# Patient Record
Sex: Female | Born: 1995 | Race: White | Hispanic: No | Marital: Single | Smoking: Former smoker
Health system: Southern US, Community
[De-identification: ages and names within clinical notes are randomized; demographics above are authoritative.]

## PROBLEM LIST (undated history)

## (undated) DIAGNOSIS — R112 Nausea with vomiting, unspecified: Secondary | ICD-10-CM

## (undated) DIAGNOSIS — K8020 Calculus of gallbladder without cholecystitis without obstruction: Secondary | ICD-10-CM

## (undated) DIAGNOSIS — R519 Headache, unspecified: Secondary | ICD-10-CM

## (undated) DIAGNOSIS — G932 Benign intracranial hypertension: Secondary | ICD-10-CM

## (undated) DIAGNOSIS — F419 Anxiety disorder, unspecified: Secondary | ICD-10-CM

## (undated) DIAGNOSIS — F410 Panic disorder [episodic paroxysmal anxiety] without agoraphobia: Secondary | ICD-10-CM

## (undated) DIAGNOSIS — F329 Major depressive disorder, single episode, unspecified: Secondary | ICD-10-CM

## (undated) DIAGNOSIS — T7840XA Allergy, unspecified, initial encounter: Secondary | ICD-10-CM

## (undated) DIAGNOSIS — E282 Polycystic ovarian syndrome: Secondary | ICD-10-CM

## (undated) DIAGNOSIS — K589 Irritable bowel syndrome without diarrhea: Secondary | ICD-10-CM

## (undated) HISTORY — DX: Anxiety disorder, unspecified: F41.9

## (undated) HISTORY — PX: COLONOSCOPY: SHX174

## (undated) HISTORY — DX: Major depressive disorder, single episode, unspecified: F32.9

## (undated) HISTORY — PX: UPPER GI ENDOSCOPY: SHX6162

## (undated) HISTORY — DX: Benign intracranial hypertension: G93.2

## (undated) HISTORY — DX: Allergy, unspecified, initial encounter: T78.40XA

---
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
## 2017-01-17 ENCOUNTER — Other Ambulatory Visit: Payer: Self-pay | Admitting: Physician Assistant

## 2017-01-27 ENCOUNTER — Encounter: Payer: Self-pay | Admitting: Physician Assistant

## 2017-03-22 NOTE — Progress Notes (Signed)
Assessment and Plan:    Migraine without aura and responsive to treatment Continue effexor, avoid triggers, normal neuro  Essential hypertension -     CBC with Differential/Platelet -     BASIC METABOLIC PANEL WITH GFR -     Hepatic function panel -     TSH -     Urinalysis, Routine w reflex microscopic -     Microalbumin / creatinine urine ratio  Gastroesophageal reflux disease without esophagitis  Anxiety -     diazepam (VALIUM) 2 MG tablet; Take 1 tablet (2 mg total) by mouth every 12 (twelve) hours as needed for anxiety.  Screening cholesterol level -     Lipid panel  Medication management -     Magnesium  Vitamin D deficiency -     VITAMIN D 25 Hydroxy (Vit-D Deficiency, Fractures)  Screening for deficiency anemia -     Vitamin B12  Routine general medical examination at a health care facility  Morbid Obesity with co morbidities - long discussion about weight loss, diet, and exercise  Continue diet and meds as discussed. Further disposition pending results of labs.  HPI 21 y.o. female  presents for new patient physical, has been seeing pediatrician.   Her blood pressure has been controlled at home, today their BP is BP: 122/90  She started on effexor July 2014, she is on 3 a day and doing well, looking in to online therapy.  She states the xanax does not effect her, will try valium 2mg  PRN #10 She denies headaches.   She does workout, does yoga 3-4 times and runs a few times a week. She denies chest pain, shortness of breath, dizziness. At [employer], and will transfer to [employer].  Has boyfriend, Patricia Davis, psych.  On the patch, likes it, menses are regular.  Has family history of PCOS.  BMI is Body mass index is 32.49 kg/m., she is working on diet and exercise. Wt Readings from Last 3 Encounters:  03/23/17 226 lb 6.4 oz (102.7 kg) (99 %, Z= 2.27)*  01/21/16 227 lb 6.4 oz (103.1 kg) (99 %, Z= 2.28)*  10/20/15 225 lb (102.1 kg) (99 %, Z= 2.26)*   *  Growth percentiles are based on CDC 2-20 Years data.     Current Medications:  Current Outpatient Prescriptions on File Prior to Visit  Medication Sig  . ALPRAZolam (XANAX) 0.5 MG tablet 1/2-1 table as needed up to 2 x a day for anxiety  . venlafaxine XR (EFFEXOR-XR) 75 MG 24 hr capsule Take 3 capsules (225 mg total) by mouth daily with breakfast.  . Burr Medico 150-35 MCG/24HR transdermal patch APPLY ONE PATCH TOPICALLY ONCE A WEEK   No current facility-administered medications on file prior to visit.    Medical History:  Past Medical History:  Diagnosis Date  . Anxiety   . Pseudotumor cerebri 2010   TDAP 2010 HPV vaccines- will start Up to date on rest- see records from peds  Review of Systems:  Review of Systems  Constitutional: Negative.   HENT: Negative.   Eyes: Negative.   Respiratory: Negative.   Cardiovascular: Negative for chest pain, palpitations, orthopnea, claudication, leg swelling and PND.  Gastrointestinal: Negative.   Genitourinary: Negative.  Negative for frequency.  Musculoskeletal: Negative.   Skin: Negative.   Neurological: Negative.  Negative for dizziness.  Psychiatric/Behavioral: Negative for depression, hallucinations, memory loss, substance abuse and suicidal ideas. The patient is not nervous/anxious (improved with effexor) and does not have insomnia.    Allergies  Allergies  Allergen Reactions  . Sertraline     Other reaction(s): Laryngeal Edema (ALLERGY)  . Bactrim [Sulfamethoxazole-Trimethoprim] Other (See Comments)    Nausea, yeast    SURGICAL HISTORY She  has a past surgical history that includes Wisdom tooth extraction (05/2014). FAMILY HISTORY Her family history includes Depression in her father and mother. SOCIAL HISTORY She  reports that she has never smoked. She has never used smokeless tobacco. She reports that she does not drink alcohol or use drugs.  Physical Exam: BP 122/90   Pulse 88   Temp (!) 97.3 F (36.3 C)   Resp 16    Ht 5\' 10"  (1.778 m)   Wt 226 lb 6.4 oz (102.7 kg)   LMP 03/22/2017   SpO2 98%   BMI 32.49 kg/m  Wt Readings from Last 3 Encounters:  03/23/17 226 lb 6.4 oz (102.7 kg) (99 %, Z= 2.27)*  01/21/16 227 lb 6.4 oz (103.1 kg) (99 %, Z= 2.28)*  10/20/15 225 lb (102.1 kg) (99 %, Z= 2.26)*   * Growth percentiles are based on CDC 2-20 Years data.   General Appearance: Well nourished, in no apparent distress. Eyes: PERRLA, EOMs, conjunctiva no swelling or erythema Sinuses: No Frontal/maxillary tenderness ENT/Mouth: Ext aud canals clear, TMs without erythema, bulging. No erythema, swelling, or exudate on post pharynx.  Tonsils not swollen or erythematous. Hearing normal.  Neck: Supple, thyroid normal.  Respiratory: Respiratory effort normal, BS equal bilaterally without rales, rhonchi, wheezing or stridor.  Cardio: RRR with no MRGs. Brisk peripheral pulses without edema.  Abdomen: Soft, + BS,  Non tender, no guarding, rebound, hernias, masses. Lymphatics: Non tender without lymphadenopathy.  Musculoskeletal: Full ROM, 5/5 strength, Normal gait Skin: Warm, dry without rashes, lesions, ecchymosis.  Neuro: Cranial nerves intact. Normal muscle tone, no cerebellar symptoms. Psych: Awake and oriented X 3, normal affect, Insight and Judgment appropriate.    Patricia MullingAmanda Ninoshka Wainwright, PA-C 4:04 PM [facility]

## 2017-03-23 ENCOUNTER — Ambulatory Visit (INDEPENDENT_AMBULATORY_CARE_PROVIDER_SITE_OTHER): Payer: 59 | Admitting: Physician Assistant

## 2017-03-23 ENCOUNTER — Encounter: Payer: Self-pay | Admitting: Physician Assistant

## 2017-03-23 VITALS — BP 122/90 | HR 88 | Temp 97.3°F | Resp 16 | Ht 70.0 in | Wt 226.4 lb

## 2017-03-23 DIAGNOSIS — I10 Essential (primary) hypertension: Secondary | ICD-10-CM

## 2017-03-23 DIAGNOSIS — F410 Panic disorder [episodic paroxysmal anxiety] without agoraphobia: Secondary | ICD-10-CM

## 2017-03-23 DIAGNOSIS — E6609 Other obesity due to excess calories: Secondary | ICD-10-CM

## 2017-03-23 DIAGNOSIS — K219 Gastro-esophageal reflux disease without esophagitis: Secondary | ICD-10-CM

## 2017-03-23 DIAGNOSIS — Z13220 Encounter for screening for lipoid disorders: Secondary | ICD-10-CM

## 2017-03-23 DIAGNOSIS — G43009 Migraine without aura, not intractable, without status migrainosus: Secondary | ICD-10-CM

## 2017-03-23 DIAGNOSIS — F419 Anxiety disorder, unspecified: Secondary | ICD-10-CM

## 2017-03-23 DIAGNOSIS — Z79899 Other long term (current) drug therapy: Secondary | ICD-10-CM

## 2017-03-23 DIAGNOSIS — E559 Vitamin D deficiency, unspecified: Secondary | ICD-10-CM

## 2017-03-23 DIAGNOSIS — Z6832 Body mass index (BMI) 32.0-32.9, adult: Secondary | ICD-10-CM

## 2017-03-23 DIAGNOSIS — Z130 Encounter for screening for diseases of the blood and blood-forming organs and certain disorders involving the immune mechanism: Secondary | ICD-10-CM

## 2017-03-23 DIAGNOSIS — Z0000 Encounter for general adult medical examination without abnormal findings: Secondary | ICD-10-CM

## 2017-03-23 MED ORDER — DIAZEPAM 2 MG PO TABS: 2.0000 mg | ORAL_TABLET | Freq: Two times a day (BID) | ORAL | 0 refills | Status: AC | PRN

## 2017-03-23 NOTE — Patient Instructions (Addendum)
Drink 80-100 oz a day of water, measure it out Eat 3 meals a day, have to do breakfast, eat protein- hard boiled eggs, protein bar like nature valley protein bar, greek yogurt like oikos triple zero, chobani 100, or light n fit greek   HOW TO TREAT VIRAL COUGH AND COLD SYMPTOMS:  -Symptoms usually last at least 1 week with the worst symptoms being around day 4.  - colds usually start with a sore throat and end with a cough, and the cough can take 2 weeks to get better.  -No antibiotics are needed for colds, flu, sore throats, cough, bronchitis UNLESS symptoms are longer than 7 days OR if you are getting better then get drastically worse.  -There are a lot of combination medications (Dayquil, Nyquil, Vicks 44, tyelnol cold and sinus, ETC). Please look at the ingredients on the back so that you are treating the correct symptoms and not doubling up on medications/ingredients.    Medicines you can use  Nasal congestion  - pseudoephedrine (Sudafed)- behind the counter, do not use if you have high blood pressure, medicine that have -D in them.  - phenylephrine (Sudafed PE) -Dextormethorphan + chlorpheniramine (Coridcidin HBP)- okay if you have high blood pressure -Oxymetazoline (Afrin) nasal spray- LIMIT to 3 days -Saline nasal spray -Neti pot (used distilled or bottled water)  Ear pain/congestion  -pseudoephedrine (sudafed) - Nasonex/flonase nasal spray  Fever  -Acetaminophen (Tyelnol) -Ibuprofen (Advil, motrin, aleve)  Sore Throat  -Acetaminophen (Tyelnol) -Ibuprofen (Advil, motrin, aleve) -Drink a lot of water -Gargle with salt water - Rest your voice (don't talk) -Throat sprays -Cough drops  Body Aches  -Acetaminophen (Tyelnol) -Ibuprofen (Advil, motrin, aleve)  Headache  -Acetaminophen (Tyelnol) -Ibuprofen (Advil, motrin, aleve) - Exedrin, Exedrin Migraine  Allergy symptoms (cough, sneeze, runny nose, itchy eyes) -Claritin or loratadine cheapest but likely the weakest   -Zyrtec or certizine at night because it can make you sleepy -The strongest is allegra or fexafinadine  Cheapest at [employer], [employer], [employer]  Cough  -Dextromethorphan (Delsym)- medicine that has DM in it -Guafenesin (Mucinex/Robitussin) - cough drops - drink lots of water  Chest Congestion  -Guafenesin (Mucinex/Robitussin)  Red Itchy Eyes  - Naphcon-A  Upset Stomach  - Bland diet (nothing spicy, greasy, fried, and high acid foods like tomatoes, oranges, berries) -OKAY- cereal, bread, soup, crackers, rice -Eat smaller more frequent meals -reduce caffeine, no alcohol -Loperamide (Imodium-AD) if diarrhea -Prevacid for heart burn  General health when sick  -Hydration -wash your hands frequently -keep surfaces clean -change pillow cases and sheets often -Get fresh air but do not exercise strenuously -Vitamin D, double up on it - Vitamin C -Zinc    Simple math prevails.    1st - exercise does not produce significant weight loss - at best one converts fat into muscle , "bulks up", loses inches, but usually stays "weight neutral"     2nd - think of your body weightas a check book: If you eat more calories than you burn up - you save money or gain weight .... Or if you spend more money than you put in the check book, ie burn up more calories than you eat, then you lose weight     3rd - if you walk or run 1 mile, you burn up 100 calories - you have to burn up 3,500 calories to lose 1 pound, ie you have to walk/run 35 miles to lose 1 measly pound. So if you want to lose 10 #, then you  have to walk/run 350 miles, so.... clearly exercise is not the solution.     4. So if you consume 1,500 calories, then you have to burn up the equivalent of 15 miles to stay weight neutral - It also stands to reason that if you consume 1,500 cal/day and don't lose weight, then you must be burning up about 1,500 cals/day to stay weight neutral.     5. If you really want to lose weight, you must cut  your calorie intake 300 calories /day and at that rate you should lose about 1 # every 3 days.   6. Please purchase Dr Francis Dowse Fuhrman's book(s) "The End of Dieting" & "Eat to Live" . It has some great concepts and recipes.

## 2017-03-24 LAB — URINE CULTURE
MICRO NUMBER:: 80883585
Result:: NO GROWTH
SPECIMEN QUALITY:: ADEQUATE

## 2017-03-24 LAB — LIPID PANEL
CHOL/HDL RATIO: 3.2 (calc) (ref <5.0)
Cholesterol: 223 mg/dL — ABNORMAL HIGH (ref <170)
HDL: 69 mg/dL (ref >45)
LDL CHOLESTEROL (CALC): 123 mg/dL — AB (ref <110)
NON-HDL CHOLESTEROL (CALC): 154 mg/dL — AB (ref <120)
Triglycerides: 185 mg/dL — ABNORMAL HIGH (ref <90)

## 2017-03-24 LAB — TSH: TSH: 2.17 mIU/L

## 2017-03-24 LAB — URINALYSIS, ROUTINE W REFLEX MICROSCOPIC
Bacteria, UA: NONE SEEN /HPF
Bilirubin Urine: NEGATIVE
Glucose, UA: NEGATIVE
HYALINE CAST: NONE SEEN /LPF
Hgb urine dipstick: NEGATIVE
Ketones, ur: NEGATIVE
Leukocytes, UA: NEGATIVE
Nitrite: NEGATIVE
SPECIFIC GRAVITY, URINE: 1.028 (ref 1.001–1.03)
pH: 6.5 (ref 5.0–8.0)

## 2017-03-24 LAB — CBC WITH DIFFERENTIAL/PLATELET
BASOS PCT: 0.4 %
Basophils Absolute: 36 cells/uL (ref 0–200)
EOS ABS: 364 {cells}/uL (ref 15–500)
Eosinophils Relative: 4 %
HCT: 43.9 % (ref 35.0–45.0)
Hemoglobin: 14.9 g/dL (ref 11.7–15.5)
Lymphs Abs: 2348 cells/uL (ref 850–3900)
MCH: 28.9 pg (ref 27.0–33.0)
MCHC: 33.9 g/dL (ref 32.0–36.0)
MCV: 85.2 fL (ref 80.0–100.0)
MONOS PCT: 9.3 %
MPV: 10.7 fL (ref 7.5–12.5)
Neutro Abs: 5506 cells/uL (ref 1500–7800)
Neutrophils Relative %: 60.5 %
PLATELETS: 321 10*3/uL (ref 140–400)
RBC: 5.15 10*6/uL — AB (ref 3.80–5.10)
RDW: 13.4 % (ref 11.0–15.0)
TOTAL LYMPHOCYTE: 25.8 %
WBC mixed population: 846 cells/uL (ref 200–950)
WBC: 9.1 10*3/uL (ref 3.8–10.8)

## 2017-03-24 LAB — HEPATIC FUNCTION PANEL
AG RATIO: 1.5 (calc) (ref 1.0–2.5)
ALKALINE PHOSPHATASE (APISO): 90 U/L (ref 47–176)
ALT: 12 U/L (ref 5–32)
AST: 19 U/L (ref 12–32)
Albumin: 4.4 g/dL (ref 3.6–5.1)
BILIRUBIN DIRECT: 0 mg/dL (ref 0.0–0.2)
BILIRUBIN INDIRECT: 0.2 mg/dL (ref 0.2–1.1)
GLOBULIN: 2.9 g/dL (ref 2.0–3.8)
Total Bilirubin: 0.2 mg/dL (ref 0.2–1.1)
Total Protein: 7.3 g/dL (ref 6.3–8.2)

## 2017-03-24 LAB — BASIC METABOLIC PANEL WITH GFR
BUN: 8 mg/dL (ref 7–20)
CO2: 24 mmol/L (ref 20–32)
CREATININE: 0.66 mg/dL (ref 0.50–1.00)
Calcium: 9.7 mg/dL (ref 8.9–10.4)
Chloride: 104 mmol/L (ref 98–110)
GFR, EST AFRICAN AMERICAN: 148 mL/min/{1.73_m2} (ref >=60)
GFR, Est Non African American: 128 mL/min/{1.73_m2} (ref >=60)
GLUCOSE: 82 mg/dL (ref 65–99)
Potassium: 3.6 mmol/L — ABNORMAL LOW (ref 3.8–5.1)
SODIUM: 140 mmol/L (ref 135–146)

## 2017-03-24 LAB — VITAMIN B12: VITAMIN B 12: 463 pg/mL (ref 200–1100)

## 2017-03-24 LAB — MICROALBUMIN / CREATININE URINE RATIO
CREATININE, URINE: 406 mg/dL — AB (ref 20–320)
MICROALB UR: 15.3 mg/dL
MICROALB/CREAT RATIO: 38 ug/mg{creat} — AB (ref <30)

## 2017-03-24 LAB — MAGNESIUM: MAGNESIUM: 1.9 mg/dL (ref 1.5–2.5)

## 2017-03-24 LAB — VITAMIN D 25 HYDROXY (VIT D DEFICIENCY, FRACTURES): VIT D 25 HYDROXY: 36 ng/mL (ref 30–100)

## 2017-03-29 NOTE — Progress Notes (Signed)
LVM for pt to return office call for LAB results.

## 2017-04-04 NOTE — Progress Notes (Signed)
LVM for pt to return office call for LAB results. Lab results w/ lab letter to explain lab results were mailed to pt.

## 2017-04-05 ENCOUNTER — Other Ambulatory Visit: Payer: Self-pay | Admitting: Internal Medicine

## 2017-04-20 ENCOUNTER — Ambulatory Visit: Payer: Self-pay

## 2017-05-04 ENCOUNTER — Other Ambulatory Visit: Payer: 59

## 2017-05-04 ENCOUNTER — Other Ambulatory Visit: Payer: Self-pay

## 2017-05-04 ENCOUNTER — Ambulatory Visit: Payer: Self-pay

## 2017-05-04 DIAGNOSIS — N390 Urinary tract infection, site not specified: Secondary | ICD-10-CM

## 2017-05-05 LAB — URINALYSIS, ROUTINE W REFLEX MICROSCOPIC
Bilirubin Urine: NEGATIVE
Glucose, UA: NEGATIVE
HGB URINE DIPSTICK: NEGATIVE
Ketones, ur: NEGATIVE
LEUKOCYTES UA: NEGATIVE
NITRITE: NEGATIVE
PROTEIN: NEGATIVE
Specific Gravity, Urine: 1.007 (ref 1.001–1.03)
pH: 7 (ref 5.0–8.0)

## 2017-05-05 LAB — URINE CULTURE
MICRO NUMBER: 81067747
SPECIMEN QUALITY: ADEQUATE

## 2017-05-06 NOTE — Progress Notes (Signed)
LVM for pt to return office call for LAB results.

## 2017-05-09 NOTE — Progress Notes (Signed)
LVM for pt to return office call for LAB results.

## 2017-05-11 NOTE — Progress Notes (Signed)
Pt aware of lab results & voiced understanding of those results.

## 2017-06-04 ENCOUNTER — Other Ambulatory Visit: Payer: Self-pay | Admitting: Physician Assistant

## 2017-06-22 ENCOUNTER — Ambulatory Visit: Payer: Self-pay | Admitting: Physician Assistant

## 2017-06-27 DIAGNOSIS — H66009 Acute suppurative otitis media without spontaneous rupture of ear drum, unspecified ear: Secondary | ICD-10-CM | POA: Diagnosis not present

## 2017-07-04 ENCOUNTER — Other Ambulatory Visit: Payer: Self-pay | Admitting: Physician Assistant

## 2017-10-02 ENCOUNTER — Other Ambulatory Visit: Payer: Self-pay | Admitting: Adult Health

## 2017-10-05 NOTE — Progress Notes (Deleted)
Assessment and Plan:    Migraine without aura and responsive to treatment Continue effexor, avoid triggers, normal neuro  Essential hypertension -     CBC with Differential/Platelet -     BASIC METABOLIC PANEL WITH GFR -     Hepatic function panel -     TSH -     Urinalysis, Routine w reflex microscopic -     Microalbumin / creatinine urine ratio  Gastroesophageal reflux disease without esophagitis  Anxiety -     diazepam (VALIUM) 2 MG tablet; Take 1 tablet (2 mg total) by mouth every 12 (twelve) hours as needed for anxiety.  Screening cholesterol level -     Lipid panel  Medication management -     Magnesium  Vitamin D deficiency -     VITAMIN D 25 Hydroxy (Vit-D Deficiency, Fractures)  Screening for deficiency anemia -     Vitamin B12  Routine general medical examination at a health care facility  Morbid Obesity with co morbidities - long discussion about weight loss, diet, and exercise  Continue diet and meds as discussed. Further disposition pending results of labs.  HPI 22 y.o. female  presents for new patient physical, has been seeing pediatrician.   Her blood pressure has been controlled at home, today their BP is    She started on effexor July 2014, she is on 3 a day and doing well, looking in to online therapy.  She states the xanax does not effect her, will try valium 2mg  PRN #10 She denies headaches.   She does workout, does yoga 3-4 times and runs a few times a week. She denies chest pain, shortness of breath, dizziness. At [employer], and will transfer to [employer].  Has boyfriend, max, psych.  On the patch, likes it, menses are regular.  Has family history of PCOS.  BMI is There is no height or weight on file to calculate BMI., she is working on diet and exercise. Wt Readings from Last 3 Encounters:  03/23/17 226 lb 6.4 oz (102.7 kg) (99 %, Z= 2.27)*  01/21/16 227 lb 6.4 oz (103.1 kg) (99 %, Z= 2.28)*  10/20/15 225 lb (102.1 kg) (99 %, Z=  2.26)*   * Growth percentiles are based on CDC (Girls, 2-20 Years) data.     Current Medications:  Current Outpatient Medications on File Prior to Visit  Medication Sig  . aspirin EC 81 MG tablet Take 81 mg by mouth daily.  . Cholecalciferol (VITAMIN D PO) Take by mouth.  . diazepam (VALIUM) 2 MG tablet Take 1 tablet (2 mg total) by mouth every 12 (twelve) hours as needed for anxiety.  Marland Kitchen venlafaxine XR (EFFEXOR-XR) 75 MG 24 hr capsule Take 3 capsules (225 mg total) by mouth daily with breakfast.  . vitamin B-12 (CYANOCOBALAMIN) 500 MCG tablet Take 500 mcg by mouth daily.  Burr Medico 150-35 MCG/24HR transdermal patch APPLY ONE PATCH TOPICALLY ONCE A WEEK   No current facility-administered medications on file prior to visit.    Medical History:  Past Medical History:  Diagnosis Date  . Anxiety   . Pseudotumor cerebri 2010   TDAP 2010 HPV vaccines- will start Up to date on rest- see records from peds  Review of Systems:  Review of Systems  Constitutional: Negative.   HENT: Negative.   Eyes: Negative.   Respiratory: Negative.   Cardiovascular: Negative for chest pain, palpitations, orthopnea, claudication, leg swelling and PND.  Gastrointestinal: Negative.   Genitourinary: Negative.  Negative for  frequency.  Musculoskeletal: Negative.   Skin: Negative.   Neurological: Negative.  Negative for dizziness.  Psychiatric/Behavioral: Negative for depression, hallucinations, memory loss, substance abuse and suicidal ideas. The patient is not nervous/anxious (improved with effexor) and does not have insomnia.    Allergies Allergies  Allergen Reactions  . Sertraline     Other reaction(s): Laryngeal Edema (ALLERGY)  . Bactrim [Sulfamethoxazole-Trimethoprim] Other (See Comments)    Nausea, yeast    SURGICAL HISTORY She  has a past surgical history that includes Wisdom tooth extraction (05/2014). FAMILY HISTORY Her family history includes Depression in her father and  mother. SOCIAL HISTORY She  reports that  has never smoked. she has never used smokeless tobacco. She reports that she does not drink alcohol or use drugs.  Physical Exam: There were no vitals taken for this visit. Wt Readings from Last 3 Encounters:  03/23/17 226 lb 6.4 oz (102.7 kg) (99 %, Z= 2.27)*  01/21/16 227 lb 6.4 oz (103.1 kg) (99 %, Z= 2.28)*  10/20/15 225 lb (102.1 kg) (99 %, Z= 2.26)*   * Growth percentiles are based on CDC (Girls, 2-20 Years) data.   General Appearance: Well nourished, in no apparent distress. Eyes: PERRLA, EOMs, conjunctiva no swelling or erythema Sinuses: No Frontal/maxillary tenderness ENT/Mouth: Ext aud canals clear, TMs without erythema, bulging. No erythema, swelling, or exudate on post pharynx.  Tonsils not swollen or erythematous. Hearing normal.  Neck: Supple, thyroid normal.  Respiratory: Respiratory effort normal, BS equal bilaterally without rales, rhonchi, wheezing or stridor.  Cardio: RRR with no MRGs. Brisk peripheral pulses without edema.  Abdomen: Soft, + BS,  Non tender, no guarding, rebound, hernias, masses. Lymphatics: Non tender without lymphadenopathy.  Musculoskeletal: Full ROM, 5/5 strength, Normal gait Skin: Warm, dry without rashes, lesions, ecchymosis.  Neuro: Cranial nerves intact. Normal muscle tone, no cerebellar symptoms. Psych: Awake and oriented X 3, normal affect, Insight and Judgment appropriate.    Quentin MullingAmanda Huntley Demedeiros, PA-C 7:17 AM [facility]

## 2017-10-06 ENCOUNTER — Ambulatory Visit: Payer: Self-pay | Admitting: Physician Assistant

## 2017-10-06 ENCOUNTER — Ambulatory Visit: Payer: Self-pay | Admitting: Internal Medicine

## 2017-11-16 DIAGNOSIS — E66811 Obesity, class 1: Secondary | ICD-10-CM | POA: Insufficient documentation

## 2017-11-16 DIAGNOSIS — R030 Elevated blood-pressure reading, without diagnosis of hypertension: Secondary | ICD-10-CM | POA: Insufficient documentation

## 2017-11-16 DIAGNOSIS — E785 Hyperlipidemia, unspecified: Secondary | ICD-10-CM | POA: Insufficient documentation

## 2017-11-16 DIAGNOSIS — E559 Vitamin D deficiency, unspecified: Secondary | ICD-10-CM | POA: Insufficient documentation

## 2017-11-16 DIAGNOSIS — E669 Obesity, unspecified: Secondary | ICD-10-CM | POA: Insufficient documentation

## 2017-11-16 NOTE — Progress Notes (Signed)
FOLLOW UP  Assessment and Plan:   Episodic anxiety Well managed by current regimen; continue medications; reminded to avoid daily use of benzo Stress management techniques discussed, increase water, good sleep hygiene discussed, increase exercise, and increase veggies.   Elevated BP without diagnosis of hypertension At goal today; lifestyle modification only Monitor blood pressure at home; patient to call if consistently greater than 130/80 Continue DASH diet.   Reminder to go to the ER if any CP, SOB, nausea, dizziness, severe HA, changes vision/speech, left arm numbness and tingling and jaw pain.  Cholesterol Mildly elevated at CPE check; currently working on lifestyle Continue low cholesterol diet and exercise.  Check lipid panel.   Obesity with co morbidities Long discussion about weight loss, diet, and exercise Recommended diet heavy in fruits and veggies and low in animal meats, cheeses, and dairy products, appropriate calorie intake Discussed ideal weight for height  Patient will work on continuing with diet/exercise efforts Will follow up in 6 months   Continue diet and meds as discussed. Further disposition pending results of labs. Discussed med's effects and SE's.   Over 30 minutes of exam, counseling, chart review, and critical decision making was performed.   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    she has a diagnosis of anxiety and is currently on effexor-XR 225 mg daily and PRN valium, reports symptoms are well controlled on current regimen. she currently utilizes valium very rarely. Has not needed in the past  6 months and still has the full prescription. She reports she is sleeping well.   BMI is Body mass index is 34.44 kg/m., she has been working on diet and exercise - had gained weight after family member passed away but has restarted at the gym and lost 10 lb thus far. Have been swimming; goes 3-4 times a week and stays 45-1.5 hours. Also does yoga/stretching. Aiming to lose 2-3 lb every few weeks. Has been cutting out meat and dairy.  Wt Readings from Last 3 Encounters:  11/17/17 240 lb (108.9 kg) (>99 %, Z= 2.42)*  03/23/17 226 lb 6.4 oz (102.7 kg) (99 %, Z= 2.27)*  01/21/16 227 lb 6.4 oz (103.1 kg) (99 %, Z= 2.28)*   * Growth percentiles are based on CDC (Girls, 2-20 Years) data.   Today their BP is BP: 120/86  She does workout. She denies chest pain, shortness of breath, dizziness.   She is not on cholesterol medication and denies myalgias. Her cholesterol is not at goal. The cholesterol last visit was:   Lab Results  Component Value Date   CHOL 223 (H) 03/23/2017   HDL 69 03/23/2017   LDLCALC 123 (H) 03/23/2017   TRIG 185 (H) 03/23/2017   CHOLHDL 3.2 03/23/2017   Patient is on Vitamin D supplement (but unsure of dose) and above 30 at last check:    Lab Results  Component Value Date   VD25OH 36 03/23/2017       Current Medications:  Current Outpatient Medications on File Prior to Visit  Medication Sig  . aspirin EC 81 MG tablet Take 81 mg by mouth daily.  . Cholecalciferol (VITAMIN D PO) Take by mouth.  . diazepam (VALIUM) 2 MG tablet Take 1 tablet (2 mg total) by mouth every 12 (  twelve) hours as needed for anxiety.  Marland Kitchen venlafaxine XR (EFFEXOR-XR) 75 MG 24 hr capsule Take 3 capsules (225 mg total) by mouth daily with breakfast.  . vitamin B-12 (CYANOCOBALAMIN) 500 MCG tablet Take 500 mcg by mouth daily.  Burr Medico 150-35 MCG/24HR transdermal patch APPLY ONE PATCH TOPICALLY ONCE A WEEK   No current facility-administered medications on file prior to visit.      Allergies:   Allergies  Allergen Reactions  . Sertraline     Other reaction(s): Laryngeal Edema (ALLERGY)  . Bactrim [Sulfamethoxazole-Trimethoprim] Other (See Comments)    Nausea, yeast     Medical History:  Past Medical History:  Diagnosis Date  . Anxiety   . Pseudotumor cerebri 2010   Family history- Reviewed and unchanged Social history- Reviewed and unchanged   Review of Systems:  Review of Systems  Constitutional: Negative for malaise/fatigue and weight loss.  HENT: Negative for hearing loss and tinnitus.   Eyes: Negative for blurred vision and double vision.  Respiratory: Negative for cough, sputum production, shortness of breath and wheezing.   Cardiovascular: Negative for chest pain, palpitations, orthopnea, claudication, leg swelling and PND.  Gastrointestinal: Negative for abdominal pain, blood in stool, constipation, diarrhea, heartburn, melena, nausea and vomiting.  Genitourinary: Negative.   Musculoskeletal: Negative for falls, joint pain and myalgias.  Skin: Negative for rash.  Neurological: Negative for dizziness, tingling, sensory change, weakness and headaches.  Endo/Heme/Allergies: Positive for environmental allergies. Negative for polydipsia.  Psychiatric/Behavioral: Negative.  Negative for depression, memory loss, substance abuse and suicidal ideas. The patient is not nervous/anxious and does not have insomnia.   All other systems reviewed and are negative.     Physical Exam: BP 120/86   Pulse (!) 102   Temp 97.9 F (36.6 C)   Ht 5\' 10"  (1.778 m)   Wt 240 lb (108.9 kg)   SpO2 96%   BMI 34.44 kg/m  Wt Readings from Last 3 Encounters:  11/17/17 240 lb (108.9 kg) (>99 %, Z= 2.42)*  03/23/17 226 lb 6.4 oz (102.7 kg) (99 %, Z= 2.27)*  01/21/16 227 lb 6.4 oz (103.1 kg) (99 %, Z= 2.28)*   * Growth percentiles are based on CDC (Girls, 2-20 Years) data.   General Appearance: Well nourished, in no apparent distress. Eyes: PERRLA, EOMs, conjunctiva no swelling or  erythema Sinuses: No Frontal/maxillary tenderness ENT/Mouth: Ext aud canals clear, TMs without erythema, bulging. No erythema, swelling, or exudate on post pharynx.  Tonsils not swollen or erythematous. Hearing normal.  Neck: Supple, thyroid normal.  Respiratory: Respiratory effort normal, BS equal bilaterally without rales, rhonchi, wheezing or stridor.  Cardio: RRR with no MRGs. Brisk peripheral pulses without edema.  Abdomen: Soft, + BS.  Non tender, no guarding, rebound, hernias, masses. Lymphatics: Non tender without lymphadenopathy.  Musculoskeletal: Full ROM, 5/5 strength, Normal gait Skin: Warm, dry without rashes, lesions, ecchymosis.  Neuro: Cranial nerves intact. No cerebellar symptoms.  Psych: Awake and oriented X 3, normal affect, Insight and Judgment appropriate.    Dan Maker, NP 2:49 PM [facility]

## 2017-11-17 ENCOUNTER — Encounter: Payer: Self-pay | Admitting: Adult Health

## 2017-11-17 ENCOUNTER — Ambulatory Visit: Payer: 59 | Admitting: Adult Health

## 2017-11-17 VITALS — BP 120/86 | HR 102 | Temp 97.9°F | Ht 70.0 in | Wt 240.0 lb

## 2017-11-17 DIAGNOSIS — E559 Vitamin D deficiency, unspecified: Secondary | ICD-10-CM | POA: Diagnosis not present

## 2017-11-17 DIAGNOSIS — R030 Elevated blood-pressure reading, without diagnosis of hypertension: Secondary | ICD-10-CM

## 2017-11-17 DIAGNOSIS — E669 Obesity, unspecified: Secondary | ICD-10-CM

## 2017-11-17 DIAGNOSIS — Z79899 Other long term (current) drug therapy: Secondary | ICD-10-CM | POA: Diagnosis not present

## 2017-11-17 DIAGNOSIS — F410 Panic disorder [episodic paroxysmal anxiety] without agoraphobia: Secondary | ICD-10-CM

## 2017-11-17 DIAGNOSIS — E782 Mixed hyperlipidemia: Secondary | ICD-10-CM

## 2017-11-17 DIAGNOSIS — E66811 Obesity, class 1: Secondary | ICD-10-CM

## 2017-11-17 MED ORDER — XULANE 150-35 MCG/24HR TD PTWK: MEDICATED_PATCH | TRANSDERMAL | 3 refills | Status: DC

## 2017-11-17 NOTE — Patient Instructions (Signed)
Aim for 7+ servings of fruits and vegetables daily  80+ fluid ounces of water or unsweet tea for healthy kidneys  Limit alcohol intake  Limit animal fats in diet for cholesterol and heart health - choose grass fed whenever available  Aim for low stress - take time to unwind and care for your mental health  Aim for 150 min of moderate intensity exercise weekly for heart health, and weights twice weekly for bone health  Aim for 7-9 hours of sleep daily    Remember with a vegan diet you may not get enough vitamin B12; you can get this by adding a supplement or nutritional yeast to your diet.   Vegan Diet The vegan diet excludes all foods that come from animals, including foods that are made from meat, fish, poultry, dairy, and eggs. A vegan diet may be followed for ethical or health reasons. What do I need to know about the vegan diet?  This diet includes all foods that come from plants. Some people choose not to eat sea vegetables, such as seaweed.  This diet excludes foods that come from animals.  While following this diet, it is important to find plant sources or supplements that contain the nutrients that are commonly found in animal products. Talk with your health care provider or dietitian about whether you should be taking nutritional supplements. What foods can I eat? Grains  Any. Vegetables Any (except for sea vegetables, if you choose not to eat them). Fruits Any. Protein Sources Tofu. Tempeh. Beans. Nuts. Seeds. Dairy Any dairy product that is made with milk that comes from soy, almonds, rice, hemp, or coconut. Beverages Juice. Carbonated soft drinks. Coconut milk. Tea. Sweets and Desserts Any dessert that is labeled as vegan. Ice cream that is made with soy, almond, rice, hemp, or coconut milk and does not have other animal ingredients (such as chocolate chips that are made from cow milk). Fats and Oils All vegetable-based oils, such as olive, canola, coconut,  corn, safflower, peanut, and sesame oils. All vegan butters. The items listed above may not be a complete list of recommended foods or beverages. Contact your dietitian for more options. What foods should I avoid? Grains No grains need to be avoided. All grains are included in this diet. Vegetables Sea vegetables (if you choose not to eat them). Fruits No fruits need to be avoided. All fruits are included in this diet. Protein Sources Meats. Poultry. Eggs. Fish. Seafood. Dairy Milk, cheese, yogurt, or ice cream that is made from cow milk, goat milk, or sheep milk. Beverages Cow milk. Goat milk. Sheep milk. Drinks that are sweetened with honey. Condiments Honey. Sweets and Desserts Any desserts that are made with eggs or animal milk. Honey. Cheesecake. Fats and Oils Butter. Lard. The items listed above may not be a complete list of foods and beverages to avoid. Contact your dietitian for more information. Where can I get the nutrients that are commonly found in animal products? Nutrients that are commonly found in animal products and may not be as common in a vegan diet include:  Protein.  Vitamin B12.  Vitamin D.  Iron.  Omega-3 fatty acids.  Calcium.  Zinc.  The following is a list of some plant-based sources of these nutrients. Protein Beans, such as black beans or kidney beans. Other legumes, such as lentils and split peas. Soy products. Nuts, such as almonds, [place] nuts, and pecans. Seeds, such as sunflower seeds. Tofu. Tempeh. Hummus. Vitamin B12 Breakfast cereals and prepared  products that have added vitamin B12. Vitamin D Orange juice. Fortified mushrooms. Cereals with added vitamin D. Iron Dark leafy greens. Nuts. Beans. Grain products that have added iron, such as cereals. Tofu. Tempeh. Soybeans. Quinoa. Plant-based iron is absorbed better when it is eaten with a food that has vitamin C. Omega-3 Fatty Acids Walnuts. Foods with added omega-3 fatty acids,  such as juices. Flax seeds. Canola oil and soybean oil. Tofu. Calcium Dark leafy greens, such as kale, bok choy, Chinese cabbage, collard greens, and mustard greens. Broccoli. Okra. Soy products with added calcium. Calcium-fortified breakfast cereals. Calcium-fortified fruit juices. Figs. Zinc Wheat germ, cereals, and breads that have added zinc. Baked beans. Legumes, such as cashews, chickpeas, kidney beans, and green peas. Almonds and nut butters. Tofu and other soy products. This information is not intended to replace advice given to you by your health care provider. Make sure you discuss any questions you have with your health care provider. Document Released: 03/29/2014 Document Revised: 02/13/2016 Document Reviewed: 01/08/2014 Elsevier Interactive Patient Education  [employer].

## 2017-11-18 LAB — BASIC METABOLIC PANEL WITH GFR
BUN: 7 mg/dL (ref 7–20)
CALCIUM: 10.1 mg/dL (ref 8.9–10.4)
CHLORIDE: 102 mmol/L (ref 98–110)
CO2: 27 mmol/L (ref 20–32)
CREATININE: 0.66 mg/dL (ref 0.50–1.00)
GFR, Est African American: 148 mL/min/{1.73_m2} (ref >=60)
GFR, Est Non African American: 128 mL/min/{1.73_m2} (ref >=60)
GLUCOSE: 86 mg/dL (ref 65–99)
Potassium: 4.2 mmol/L (ref 3.8–5.1)
Sodium: 138 mmol/L (ref 135–146)

## 2017-11-18 LAB — CBC WITH DIFFERENTIAL/PLATELET
BASOS ABS: 64 {cells}/uL (ref 0–200)
Basophils Relative: 0.5 %
EOS PCT: 4.1 %
Eosinophils Absolute: 525 cells/uL — ABNORMAL HIGH (ref 15–500)
HCT: 41.9 % (ref 35.0–45.0)
HEMOGLOBIN: 14.3 g/dL (ref 11.7–15.5)
Lymphs Abs: 3622 cells/uL (ref 850–3900)
MCH: 28.4 pg (ref 27.0–33.0)
MCHC: 34.1 g/dL (ref 32.0–36.0)
MCV: 83.1 fL (ref 80.0–100.0)
MONOS PCT: 6.4 %
MPV: 11.2 fL (ref 7.5–12.5)
NEUTROS ABS: 7770 {cells}/uL (ref 1500–7800)
Neutrophils Relative %: 60.7 %
Platelets: 335 10*3/uL (ref 140–400)
RBC: 5.04 10*6/uL (ref 3.80–5.10)
RDW: 14.6 % (ref 11.0–15.0)
Total Lymphocyte: 28.3 %
WBC mixed population: 819 cells/uL (ref 200–950)
WBC: 12.8 10*3/uL — ABNORMAL HIGH (ref 3.8–10.8)

## 2017-11-18 LAB — LIPID PANEL
CHOL/HDL RATIO: 3.7 (calc) (ref <5.0)
CHOLESTEROL: 223 mg/dL — AB (ref <170)
HDL: 61 mg/dL (ref >45)
LDL CHOLESTEROL (CALC): 133 mg/dL — AB (ref <110)
Non-HDL Cholesterol (Calc): 162 mg/dL (calc) — ABNORMAL HIGH (ref <120)
TRIGLYCERIDES: 158 mg/dL — AB (ref <90)

## 2017-11-18 LAB — HEPATIC FUNCTION PANEL
AG RATIO: 1.6 (calc) (ref 1.0–2.5)
ALBUMIN MSPROF: 4.5 g/dL (ref 3.6–5.1)
ALT: 15 U/L (ref 5–32)
AST: 19 U/L (ref 12–32)
Alkaline phosphatase (APISO): 92 U/L (ref 47–176)
Bilirubin, Direct: 0.1 mg/dL (ref 0.0–0.2)
GLOBULIN: 2.9 g/dL (ref 2.0–3.8)
Indirect Bilirubin: 0.1 mg/dL (calc) — ABNORMAL LOW (ref 0.2–1.1)
TOTAL PROTEIN: 7.4 g/dL (ref 6.3–8.2)
Total Bilirubin: 0.2 mg/dL (ref 0.2–1.1)

## 2017-11-18 LAB — TSH: TSH: 1.99 mIU/L

## 2017-11-29 DIAGNOSIS — H52223 Regular astigmatism, bilateral: Secondary | ICD-10-CM | POA: Diagnosis not present

## 2017-11-29 DIAGNOSIS — H52533 Spasm of accommodation, bilateral: Secondary | ICD-10-CM | POA: Diagnosis not present

## 2017-11-29 DIAGNOSIS — H43393 Other vitreous opacities, bilateral: Secondary | ICD-10-CM | POA: Diagnosis not present

## 2018-04-05 ENCOUNTER — Encounter: Payer: Self-pay | Admitting: Physician Assistant

## 2018-05-21 NOTE — Progress Notes (Deleted)
Assessment and Plan:    Continue diet and meds as discussed. Further disposition pending results of labs.  HPI 22 y.o. female  presents  For follow up for CPE.   Her blood pressure has been controlled at home, today their BP is    She started on effexor July 2014, she states at one point she had some agoraphobia, she is on 2.5 effexor, trying to decrease it before she goes to college.  Dr. Valarie Cones the psychatrist. She is on 2 75mg  and 1 37.5mg .  She has more anxiety, sweating, palpitations, occ trouble sleeping.  She denies headaches.   She does workout, does yoga 3-4 times and runs a few times a week. She denies chest pain, shortness of breath, dizziness. At [employer], [employer] and will transfer to [employer].  Has boyfriend, max, psych.  On the patch, likes it, were irregular before the patch, but now regular.  Has family history of PCOS.    She is not on cholesterol medication and denies myalgias. Her cholesterol is not at goal. The cholesterol last visit was:   Lab Results  Component Value Date   CHOL 223 (H) 11/17/2017   HDL 61 11/17/2017   LDLCALC 133 (H) 11/17/2017   TRIG 158 (H) 11/17/2017   CHOLHDL 3.7 11/17/2017   Last O1H in the office was:  Lab Results  Component Value Date   HGBA1C 5.5 01/21/2016   Patient is on Vitamin D supplement.   Lab Results  Component Value Date   VD25OH 36 03/23/2017      BMI is There is no height or weight on file to calculate BMI., she is working on diet and exercise. Wt Readings from Last 3 Encounters:  11/17/17 240 lb (108.9 kg) (>99 %, Z= 2.42)*  03/23/17 226 lb 6.4 oz (102.7 kg) (99 %, Z= 2.27)*  01/21/16 227 lb 6.4 oz (103.1 kg) (99 %, Z= 2.28)*   * Growth percentiles are based on CDC (Girls, 2-20 Years) data.     Current Medications:   Current Outpatient Medications (Endocrine & Metabolic):  Marland Kitchen  XULANE 150-35 MCG/24HR transdermal patch, APPLY ONE PATCH TOPICALLY ONCE A WEEK    Current Outpatient Medications  (Analgesics):  .  aspirin EC 81 MG tablet, Take 81 mg by mouth daily.  Current Outpatient Medications (Hematological):  .  vitamin B-12 (CYANOCOBALAMIN) 500 MCG tablet, Take 500 mcg by mouth daily.  Current Outpatient Medications (Other):  Marland Kitchen  Cholecalciferol (VITAMIN D PO), Take by mouth. .  venlafaxine XR (EFFEXOR-XR) 75 MG 24 hr capsule, Take 3 capsules (225 mg total) by mouth daily with breakfast.  Medical History:  Past Medical History:  Diagnosis Date  . Anxiety   . Pseudotumor cerebri 2010   TDAP 2010 HPV vaccines- will start Up to date on rest- see records from peds  Review of Systems:  Review of Systems  Constitutional: Negative.   HENT: Negative.   Eyes: Negative.   Respiratory: Negative.   Cardiovascular: Negative for chest pain, palpitations, orthopnea, claudication, leg swelling and PND.  Gastrointestinal: Negative.   Genitourinary: Negative.  Negative for frequency.  Musculoskeletal: Negative.   Skin: Negative.   Neurological: Negative.  Negative for dizziness.  Psychiatric/Behavioral: Negative for depression, hallucinations, memory loss, substance abuse and suicidal ideas. The patient is not nervous/anxious (improved with effexor) and does not have insomnia.    Allergies Allergies  Allergen Reactions  . Sertraline     Other reaction(s): Laryngeal Edema (ALLERGY)  . Bactrim [Sulfamethoxazole-Trimethoprim] Other (See Comments)  Nausea, yeast   Surgical history Past Surgical History:  Procedure Laterality Date  . WISDOM TOOTH EXTRACTION  05/2014   Family history Family History  Problem Relation Age of Onset  . Depression Mother   . Depression Father    Physical Exam: There were no vitals taken for this visit. Wt Readings from Last 3 Encounters:  11/17/17 240 lb (108.9 kg) (>99 %, Z= 2.42)*  03/23/17 226 lb 6.4 oz (102.7 kg) (99 %, Z= 2.27)*  01/21/16 227 lb 6.4 oz (103.1 kg) (99 %, Z= 2.28)*   * Growth percentiles are based on CDC (Girls, 2-20  Years) data.   General Appearance: Well nourished, in no apparent distress. Eyes: PERRLA, EOMs, conjunctiva no swelling or erythema Sinuses: No Frontal/maxillary tenderness ENT/Mouth: Ext aud canals clear, TMs without erythema, bulging. No erythema, swelling, or exudate on post pharynx.  Tonsils not swollen or erythematous. Hearing normal.  Neck: Supple, thyroid normal.  Respiratory: Respiratory effort normal, BS equal bilaterally without rales, rhonchi, wheezing or stridor.  Cardio: RRR with no MRGs. Brisk peripheral pulses without edema.  Abdomen: Soft, + BS,  Non tender, no guarding, rebound, hernias, masses. Lymphatics: Non tender without lymphadenopathy.  Musculoskeletal: Full ROM, 5/5 strength, Normal gait Skin: Warm, dry without rashes, lesions, ecchymosis.  Neuro: Cranial nerves intact. Normal muscle tone, no cerebellar symptoms. Psych: Awake and oriented X 3, normal affect, Insight and Judgment appropriate.    Quentin Mulling, PA-C 9:19 AM [facility]

## 2018-05-23 ENCOUNTER — Encounter: Payer: Self-pay | Admitting: Physician Assistant

## 2018-05-24 ENCOUNTER — Other Ambulatory Visit: Payer: Self-pay

## 2018-05-24 MED ORDER — XULANE 150-35 MCG/24HR TD PTWK: MEDICATED_PATCH | TRANSDERMAL | 3 refills | Status: DC

## 2018-05-24 NOTE — Telephone Encounter (Signed)
Refill request for [employer]

## 2018-06-05 NOTE — Progress Notes (Signed)
Assessment and Plan:    Episodic paroxysmal anxiety disorder Continue effexor, start counseling, try buspar, close follow up 4 weeks.  Pt denies SI and is at an acute low risk for suicide. Patient told to call clinic if any problems occur. Patient advised to go to ER if they should develop SI/HI, side effects, or if symptoms worsen. Has crisis numbers to call if needed. Pt verbalized understanding. -     busPIRone (BUSPAR) 10 MG tablet; Take 1 tablet (10 mg total) by mouth 2 (two) times daily.  Vitamin D deficiency -     VITAMIN D 25 Hydroxy (Vit-D Deficiency, Fractures)  Gastroesophageal reflux disease without esophagitis Continue PPI/H2 blocker, diet discussed  Migraine without aura and responsive to treatment Continue meds  Obesity (BMI 30.0-34.9) Discussed binge eating, mindful eating Follow up 4 weeks -     Hemoglobin A1c  Mixed hyperlipidemia check lipids decrease fatty foods increase activity.  -     Lipid panel  Elevated BP without diagnosis of hypertension - continue medications, DASH diet, exercise and monitor at home. Call if greater than 130/80.  -     CBC with Differential/Platelet -     COMPLETE METABOLIC PANEL WITH GFR -     TSH -     Urinalysis, Routine w reflex microscopic -     Microalbumin / creatinine urine ratio  Medication management -     Magnesium  Screening, anemia, deficiency, iron -     Iron,Total/Total Iron Binding Cap -     Vitamin B12  RUQ pain -     US Abdomen Complete; Future + murphy's sign, worse with fatty foods, get AB [place], if negative will consider HIDA with convincing story     Continue diet and meds as discussed. Further disposition pending results of labs.  HPI 22 y.o. female  presents  For follow up for CPE.   Her blood pressure has been controlled at home, today their BP is BP: 128/86   She has history of IBS, but she has been having worsening AB pain x 1 month. Has epigastric pain, that can go through to her back.  Eating can make it worse. Has history of nausea, burping, beltching. She has had chills/fever last week. She denies dark black stool.   She started on effexor July 2014,  she is she is on effexor 3 a day, has been worse last 4-5 months with worsening agoraphobia, has missed work due to panic attacks and stress.  Dr. Amado Nash the psychatrist. Was on zoloft before. She has valium for as needed but states it is too strong to function/drive with.     She has more anxiety, sweating, palpitations, occ trouble sleeping.  She denies headaches.   She does workout, does yoga 3-4 times and runs a few times a week. She denies chest pain, shortness of breath, dizziness.  At [employer], [employer] and will transfer to [employer].  Has boyfriend, max, psych.  On the patch, likes it, were irregular before the patch, but now regular.  Has family history of PCOS.    She is not on cholesterol medication and denies myalgias. Her cholesterol is not at goal. The cholesterol last visit was:   Lab Results  Component Value Date   CHOL 223 (H) 11/17/2017   HDL 61 11/17/2017   LDLCALC 133 (H) 11/17/2017   TRIG 158 (H) 11/17/2017   CHOLHDL 3.7 11/17/2017   Last A1C in the office was:  Lab Results  Component Value  Date   HGBA1C 5.5 01/21/2016   Patient is on Vitamin D supplement.   Lab Results  Component Value Date   VD25OH 36 03/23/2017     BMI is Body mass index is 34.32 kg/m., she is working on diet and exercise. Wt Readings from Last 3 Encounters:  06/07/18 242 lb 9.6 oz (110 kg)  11/17/17 240 lb (108.9 kg) (>99 %, Z= 2.42)*  03/23/17 226 lb 6.4 oz (102.7 kg) (99 %, Z= 2.27)*   * Growth percentiles are based on CDC (Girls, 2-20 Years) data.     Current Medications:   Current Outpatient Medications (Endocrine & Metabolic):  Marland Kitchen  XULANE 150-35 MCG/24HR transdermal patch, APPLY ONE PATCH TOPICALLY ONCE A WEEK    Current Outpatient Medications (Analgesics):  .  aspirin EC 81 MG tablet, Take 81 mg by  mouth daily.  Current Outpatient Medications (Hematological):  .  vitamin B-12 (CYANOCOBALAMIN) 500 MCG tablet, Take 500 mcg by mouth daily.  Current Outpatient Medications (Other):  Marland Kitchen  Cholecalciferol (VITAMIN D PO), Take by mouth. .  venlafaxine XR (EFFEXOR-XR) 75 MG 24 hr capsule, Take 3 capsules (225 mg total) by mouth daily with breakfast.  Medical History:  Past Medical History:  Diagnosis Date  . Anxiety   . Pseudotumor cerebri 2010   Immunization History  Administered Date(s) Administered  . DTaP 05/21/1998, 08/05/1998, 10/16/1998, 07/14/1999, 04/30/2002  . H1N1 07/31/2008  . Hepatitis B 10/16/1998, 12/24/1998, 07/14/1999  . HiB (PRP-OMP) 05/21/1998, 08/05/1998, 07/14/1999  . IPV 05/21/1998, 08/05/1998, 04/04/1999, 04/30/2002  . Influenza Nasal 06/20/2007, 05/11/2012  . Influenza-Unspecified 07/31/2008  . MMR 04/04/1999, 04/30/2002  . Pneumococcal Conjugate-13 07/14/1999  . Td 03/13/2009  . Tdap 03/13/2009  . Varicella 04/04/1999, 03/13/2009    TDAP 2010 Up to date on rest- see records from peds  She is sexually active, on the patch, on bASA, uses protection.   Review of Systems:  Review of Systems  Constitutional: Negative.   HENT: Negative.   Eyes: Negative.   Respiratory: Negative.   Cardiovascular: Negative for chest pain, palpitations, orthopnea, claudication, leg swelling and PND.  Gastrointestinal: Positive for abdominal pain and nausea.  Genitourinary: Negative.  Negative for frequency.  Musculoskeletal: Negative.   Skin: Negative.   Neurological: Negative.  Negative for dizziness.  Psychiatric/Behavioral: Negative for depression, hallucinations, memory loss, substance abuse and suicidal ideas. The patient is nervous/anxious (+ anxiety). The patient does not have insomnia.    Allergies Allergies  Allergen Reactions  . Sertraline     Other reaction(s): Laryngeal Edema (ALLERGY)  . Bactrim [Sulfamethoxazole-Trimethoprim] Other (See Comments)     Nausea, yeast   Surgical history Past Surgical History:  Procedure Laterality Date  . WISDOM TOOTH EXTRACTION  05/2014   Family history Family History  Problem Relation Age of Onset  . Depression Mother   . Depression Father    Physical Exam: BP 128/86   Pulse 92   Temp 98.2 F (36.8 C)   Ht 5' 10.5" (1.791 m)   Wt 242 lb 9.6 oz (110 kg)   LMP 05/08/2018   SpO2 99%   BMI 34.32 kg/m  Wt Readings from Last 3 Encounters:  06/07/18 242 lb 9.6 oz (110 kg)  11/17/17 240 lb (108.9 kg) (>99 %, Z= 2.42)*  03/23/17 226 lb 6.4 oz (102.7 kg) (99 %, Z= 2.27)*   * Growth percentiles are based on CDC (Girls, 2-20 Years) data.   General Appearance: Well nourished, in no apparent distress. Eyes: PERRLA, EOMs, conjunctiva no swelling or  erythema Sinuses: No Frontal/maxillary tenderness ENT/Mouth: Ext aud canals clear, TMs without erythema, bulging. No erythema, swelling, or exudate on post pharynx.  Tonsils not swollen or erythematous. Hearing normal.  Neck: Supple, thyroid normal.  Respiratory: Respiratory effort normal, BS equal bilaterally without rales, rhonchi, wheezing or stridor.  Cardio: RRR with no MRGs. Brisk peripheral pulses without edema.  Abdomen: Soft, + BS,  + RUQ tenderness with + Murphys, no guarding, rebound, hernias, masses. Lymphatics: Non tender without lymphadenopathy.  Musculoskeletal: Full ROM, 5/5 strength, Normal gait Skin: Warm, dry without rashes, lesions, ecchymosis.  Neuro: Cranial nerves intact. Normal muscle tone, no cerebellar symptoms. Psych: Awake and oriented X 3, normal affect, Insight and Judgment appropriate.    Vicie Mutters, PA-C 10:14 AM [facility]

## 2018-06-07 ENCOUNTER — Encounter: Payer: Self-pay | Admitting: Physician Assistant

## 2018-06-07 ENCOUNTER — Encounter (INDEPENDENT_AMBULATORY_CARE_PROVIDER_SITE_OTHER): Payer: Self-pay

## 2018-06-07 ENCOUNTER — Ambulatory Visit: Payer: 59 | Admitting: Physician Assistant

## 2018-06-07 VITALS — BP 128/86 | HR 92 | Temp 98.2°F | Ht 70.5 in | Wt 242.6 lb

## 2018-06-07 DIAGNOSIS — Z79899 Other long term (current) drug therapy: Secondary | ICD-10-CM

## 2018-06-07 DIAGNOSIS — F410 Panic disorder [episodic paroxysmal anxiety] without agoraphobia: Secondary | ICD-10-CM

## 2018-06-07 DIAGNOSIS — Z130 Encounter for screening for diseases of the blood and blood-forming organs and certain disorders involving the immune mechanism: Secondary | ICD-10-CM

## 2018-06-07 DIAGNOSIS — K219 Gastro-esophageal reflux disease without esophagitis: Secondary | ICD-10-CM

## 2018-06-07 DIAGNOSIS — E669 Obesity, unspecified: Secondary | ICD-10-CM | POA: Diagnosis not present

## 2018-06-07 DIAGNOSIS — G43009 Migraine without aura, not intractable, without status migrainosus: Secondary | ICD-10-CM

## 2018-06-07 DIAGNOSIS — E782 Mixed hyperlipidemia: Secondary | ICD-10-CM | POA: Diagnosis not present

## 2018-06-07 DIAGNOSIS — Z0000 Encounter for general adult medical examination without abnormal findings: Secondary | ICD-10-CM

## 2018-06-07 DIAGNOSIS — R1011 Right upper quadrant pain: Secondary | ICD-10-CM

## 2018-06-07 DIAGNOSIS — R030 Elevated blood-pressure reading, without diagnosis of hypertension: Secondary | ICD-10-CM

## 2018-06-07 DIAGNOSIS — E66811 Obesity, class 1: Secondary | ICD-10-CM

## 2018-06-07 DIAGNOSIS — E559 Vitamin D deficiency, unspecified: Secondary | ICD-10-CM

## 2018-06-07 DIAGNOSIS — R300 Dysuria: Secondary | ICD-10-CM

## 2018-06-07 MED ORDER — BUSPIRONE HCL 10 MG PO TABS: 10.0000 mg | ORAL_TABLET | Freq: Two times a day (BID) | ORAL | 0 refills | Status: DC

## 2018-06-07 NOTE — Patient Instructions (Addendum)
Counseling services  Here are some numbers below you can try but I suggest calling your insurance and finding out who is in your network and THEN calling those people or looking them up on google.   I'm a big fan of Cognitive Behavioral Therapy, look this up on You tube or check with the therapist you see if they are certified.  This form of therapy helps to teach you skills to better handle with current situation that are causing anxiety or depression.    Google mindful eating  Rate your hunger before you eat on a scale of 1-10, try to eat closer to a 6 or higher. And if you are at below that, why are you eating?  Check out  Mini habits for weight loss book  2 apps for tracking food is myfitness pal  loseit OR can take picture of your food  Are you an emotional eater? Do you eat more when you're feeling stressed? Do you eat when you're not hungry or when you're full? Do you eat to feel better (to calm and soothe yourself when you're sad, mad, bored, anxious, etc.)? Do you reward yourself with food? Do you regularly eat until you've stuffed yourself? Does food make you feel safe? Do you feel like food is a friend? Do you feel powerless or out of control around food?  If you answered yes to some of these questions than it is likely that you are an emotional eater. This is normally a learned behavior and can take time to first recognize the signs and second BREAK THE HABIT. But here is more information and tips to help.   The difference between emotional hunger and physical hunger Emotional hunger can be powerful, so it's easy to mistake it for physical hunger. But there are clues you can look for to help you tell physical and emotional hunger apart.  Emotional hunger comes on suddenly. It hits you in an instant and feels overwhelming and urgent. Physical hunger, on the other hand, comes on more gradually. The urge to eat doesn't feel as dire or demand instant satisfaction (unless you  haven't eaten for a very long time).  Emotional hunger craves specific comfort foods. When you're physically hungry, almost anything sounds good-including healthy stuff like vegetables. But emotional hunger craves junk food or sugary snacks that provide an instant rush. You feel like you need cheesecake or pizza, and nothing else will do.  Emotional hunger often leads to mindless eating. Before you know it, you've eaten a whole bag of chips or an entire pint of ice cream without really paying attention or fully enjoying it. When you're eating in response to physical hunger, you're typically more aware of what you're doing.  Emotional hunger isn't satisfied once you're full. You keep wanting more and more, often eating until you're uncomfortably stuffed. Physical hunger, on the other hand, doesn't need to be stuffed. You feel satisfied when your stomach is full.  Emotional hunger isn't located in the stomach. Rather than a growling belly or a pang in your stomach, you feel your hunger as a craving you can't get out of your head. You're focused on specific textures, tastes, and smells.  Emotional hunger often leads to regret, guilt, or shame. When you eat to satisfy physical hunger, you're unlikely to feel guilty or ashamed because you're simply giving your body what it needs. If you feel guilty after you eat, it's likely because you know deep down that you're not eating for nutritional  reasons.  Identify your emotional eating triggers What situations, places, or feelings make you reach for the comfort of food? Most emotional eating is linked to unpleasant feelings, but it can also be triggered by positive emotions, such as rewarding yourself for achieving a goal or celebrating a holiday or happy event. Common causes of emotional eating include:  Stuffing emotions - Eating can be a way to temporarily silence or "stuff down" uncomfortable emotions, including anger, fear, sadness, anxiety, loneliness,  resentment, and shame. While you're numbing yourself with food, you can avoid the difficult emotions you'd rather not feel.  Boredom or feelings of emptiness - Do you ever eat simply to give yourself something to do, to relieve boredom, or as a way to fill a void in your life? You feel unfulfilled and empty, and food is a way to occupy your mouth and your time. In the moment, it fills you up and distracts you from underlying feelings of purposelessness and dissatisfaction with your life.  Childhood habits - Think back to your childhood memories of food. Did your parents reward good behavior with ice cream, take you out for pizza when you got a good report card, or serve you sweets when you were feeling sad? These habits can often carry over into adulthood. Or your eating may be driven by nostalgia-for cherished memories of grilling burgers in the backyard with your dad or baking and eating cookies with your mom.  Social influences - Getting together with other people for a meal is a great way to relieve stress, but it can also lead to overeating. It's easy to overindulge simply because the food is there or because everyone else is eating. You may also overeat in social situations out of nervousness. Or perhaps your family or circle of friends encourages you to overeat, and it's easier to go along with the group.  Stress - Ever notice how stress makes you hungry? It's not just in your mind. When stress is chronic, as it so often is in our chaotic, fast-paced world, your body produces high levels of the stress hormone, cortisol. Cortisol triggers cravings for salty, sweet, and fried foods-foods that give you a burst of energy and pleasure. The more uncontrolled stress in your life, the more likely you are to turn to food for emotional relief.  Find other ways to feed your feelings If you don't know how to manage your emotions in a way that doesn't involve food, you won't be able to control your eating  habits for very long. Diets so often fail because they offer logical nutritional advice which only works if you have conscious control over your eating habits. It doesn't work when emotions hijack the process, demanding an immediate payoff with food.  In order to stop emotional eating, you have to find other ways to fulfill yourself emotionally. It's not enough to understand the cycle of emotional eating or even to understand your triggers, although that's a huge first step. You need alternatives to food that you can turn to for emotional fulfillment.  Alternatives to emotional eating If you're depressed or lonely, call someone who always makes you feel better, play with your dog or cat, or look at a favorite photo or cherished memento.  If you're anxious, expend your nervous energy by dancing to your favorite song, squeezing a stress ball, or taking a brisk walk.  If you're exhausted, treat yourself with a hot cup of tea, take a bath, light some scented candles, or wrap yourself  in a warm blanket.  If you're bored, read a good book, watch a comedy show, explore the outdoors, or turn to an activity you enjoy (woodworking, playing the guitar, shooting hoops, scrapbooking, etc.).  What is mindful eating? Mindful eating is a practice that develops your awareness of eating habits and allows you to pause between your triggers and your actions. Most emotional eaters feel powerless over their food cravings. When the urge to eat hits, you feel an almost unbearable tension that demands to be fed, right now. Because you've tried to resist in the past and failed, you believe that your willpower just isn't up to snuff. But the truth is that you have more power over your cravings than you think.  Take 5 before you give in to a craving Emotional eating tends to be automatic and virtually mindless. Before you even realize what you're doing, you've reached for a tub of ice cream and polished off half of it. But if  you can take a moment to pause and reflect when you're hit with a craving, you give yourself the opportunity to make a different decision.  Can you put off eating for five minutes? Or just start with one minute. Don't tell yourself you can't give in to the craving; remember, the forbidden is extremely tempting. Just tell yourself to wait.  While you're waiting, check in with yourself. How are you feeling? What's going on emotionally? Even if you end up eating, you'll have a better understanding of why you did it. This can help you set yourself up for a different response next time.  How to practice mindful eating Eating while you're also doing other things-such as watching TV, driving, or playing with your phone-can prevent you from fully enjoying your food. Since your mind is elsewhere, you may not feel satisfied or continue eating even though you're no longer hungry. Eating more mindfully can help focus your mind on your food and the pleasure of a meal and curb overeating.   Eat your meals in a calm place with no distractions, aside from any dining companions.  Try eating with your non-dominant hand or using chopsticks instead of a knife and fork. Eating in such a non-familiar way can slow down how fast you eat and ensure your mind stays focused on your food.  Allow yourself enough time not to have to rush your meal. Set a timer for 20 minutes and pace yourself so you spend at least that much time eating.  Take small bites and chew them well, taking time to notice the different flavors and textures of each mouthful.  Put your utensils down between bites. Take time to consider how you feel-hungry, satiated-before picking up your utensils again.  Try to stop eating before you are full.It takes time for the signal to reach your brain that you've had enough. Don't feel obligated to always clean your plate.  When you've finished your food, take a few moments to assess if you're really still hungry  before opting for an extra serving or dessert.  Learn to accept your feelings-even the bad ones  While it may seem that the core problem is that you're powerless over food, emotional eating actually stems from feeling powerless over your emotions. You don't feel capable of dealing with your feelings head on, so you avoid them with food.  Recommended reading  Mini Habits for weight loss  Healthy Eating: A guide to the new nutrition Copy Special Health Report  10 Tips for Mindful Eating - How mindfulness can help you fully enjoy a meal and the experience of eating-with moderation and restraint. [facility] Health blog)  Weight Loss: Gain Control of Emotional Eating - Tips to regain control of your eating habits. [facility])  Why Stress Causes People to Overeat -Tips on controlling stress eating. [employer])  Mindful Eating Meditations -Free online mindfulness meditations. ([facility] [employer] Mindful Eating)       When it comes to diets, agreement about the perfect plan isn't easy to find, even among the experts. Experts at the [facility] of [employer] developed an idea known as the Healthy Eating Plate. Just imagine a plate divided into logical, healthy portions.  The emphasis is on diet quality:  Load up on vegetables and fruits - one-half of your plate: Aim for color and variety, and remember that potatoes don't count.  Go for whole grains - one-quarter of your plate: Whole wheat, barley, wheat berries, quinoa, oats, brown rice, and foods made with them. If you want pasta, go with whole wheat pasta.  Protein power - one-quarter of your plate: Fish, chicken, beans, and nuts are all healthy, versatile protein sources. Limit red meat.  The diet, however, does go beyond the plate, offering a few other suggestions.  Use healthy plant oils, such as olive, canola, soy, corn, sunflower and peanut. Check the labels, and avoid partially hydrogenated  oil, which have unhealthy trans fats.  If you're thirsty, drink water. Coffee and tea are good in moderation, but skip sugary drinks and limit milk and dairy products to one or two daily servings.  The type of carbohydrate in the diet is more important than the amount. Some sources of carbohydrates, such as vegetables, fruits, whole grains, and beans-are healthier than others.  Finally, stay active.    Cholecystitis Cholecystitis is inflammation of the gallbladder. It is often called a gallbladder attack. The gallbladder is a pear-shaped organ that lies beneath the liver on the right side of the body. The gallbladder stores bile, which is a fluid that helps the body to digest fats. If bile builds up in your gallbladder, your gallbladder becomes inflamed. This condition may occur suddenly (be acute). Repeat episodes of acute cholecystitis or prolonged episodes may lead to a long-term (chronic) condition. Cholecystitis is serious and it requires treatment. What are the causes? The most common cause of this condition is gallstones. Gallstones can block the tube (duct) that carries bile out of your gallbladder. This causes bile to build up. Other causes of this condition include:  Damage to the gallbladder due to a decrease in blood flow.  Infections in the bile ducts.  Scars or kinks in the bile ducts.  Tumors in the liver, pancreas, or gallbladder.  What increases the risk? This condition is more likely to develop in:  People who have sickle cell disease.  People who take birth control pills or use estrogen.  People who have alcoholic liver disease.  People who have liver cirrhosis.  People who have their nutrition delivered through a vein (parenteral nutrition).  People who do not eat or drink (do fasting) for a long period of time.  People who are obese.  People who have rapid weight loss.  People who are pregnant.  People who have increased triglyceride levels.  People  who have pancreatitis.  What are the signs or symptoms? Symptoms of this condition include:  Abdominal pain, especially in the upper right area of the abdomen.  Abdominal tenderness or bloating.  Nausea.  Vomiting.  Fever.  Chills.  Yellowing of the skin and the whites of the eyes (jaundice).  How is this diagnosed? This condition is diagnosed with a medical history and physical exam. You may also have other tests, including:  Imaging tests, such as: ? An ultrasound of the gallbladder. ? A CT scan of the abdomen. ? A gallbladder nuclear scan (HIDA scan). This scan allows your health care provider to see the bile moving from your liver to your gallbladder and to your small intestine. ? MRI.  Blood tests, such as: ? A complete blood count, because the white blood cell count may be higher than normal. ? Liver function tests, because some levels may be higher than normal with certain types of gallstones.  How is this treated? Treatment may include:  Fasting for a certain amount of time.  IV fluids.  Medicine to treat pain or vomiting.  Antibiotic medicine.  Surgery to remove your gallbladder (cholecystectomy). This may happen immediately or at a later time.  Follow these instructions at home: Home care will depend on your treatment. In general:  Take over-the-counter and prescription medicines only as told by your health care provider.  If you were prescribed an antibiotic medicine, take it as told by your health care provider. Do not stop taking the antibiotic even if you start to feel better.  Follow instructions from your health care provider about what to eat or drink. When you are allowed to eat, avoid eating or drinking anything that triggers your symptoms.  Keep all follow-up visits as told by your health care provider. This is important.  Contact a health care provider if:  Your pain is not controlled with medicine.  You have a fever. Get help right  away if:  Your pain moves to another part of your abdomen or to your back.  You continue to have symptoms or you develop new symptoms even with treatment. This information is not intended to replace advice given to you by your health care provider. Make sure you discuss any questions you have with your health care provider. Document Released: 07/26/2005 Document Revised: 12/04/2015 Document Reviewed: 11/06/2014 Elsevier Interactive Patient Education  2018 [employer].

## 2018-06-08 ENCOUNTER — Other Ambulatory Visit: Payer: Self-pay

## 2018-06-08 DIAGNOSIS — R300 Dysuria: Secondary | ICD-10-CM

## 2018-06-08 LAB — URINALYSIS, ROUTINE W REFLEX MICROSCOPIC
BILIRUBIN URINE: NEGATIVE
GLUCOSE, UA: NEGATIVE
Hgb urine dipstick: NEGATIVE
Hyaline Cast: NONE SEEN /LPF
Ketones, ur: NEGATIVE
LEUKOCYTES UA: NEGATIVE
NITRITE: NEGATIVE
PH: 6.5 (ref 5.0–8.0)
RBC / HPF: NONE SEEN /HPF (ref 0–2)
SPECIFIC GRAVITY, URINE: 1.021 (ref 1.001–1.03)

## 2018-06-08 LAB — CBC WITH DIFFERENTIAL/PLATELET
BASOS ABS: 57 {cells}/uL (ref 0–200)
Basophils Relative: 0.4 %
Eosinophils Absolute: 454 cells/uL (ref 15–500)
Eosinophils Relative: 3.2 %
HEMATOCRIT: 43.5 % (ref 35.0–45.0)
Hemoglobin: 15 g/dL (ref 11.7–15.5)
LYMPHS ABS: 3252 {cells}/uL (ref 850–3900)
MCH: 28.6 pg (ref 27.0–33.0)
MCHC: 34.5 g/dL (ref 32.0–36.0)
MCV: 83 fL (ref 80.0–100.0)
MPV: 11.2 fL (ref 7.5–12.5)
Monocytes Relative: 6.5 %
NEUTROS PCT: 67 %
Neutro Abs: 9514 cells/uL — ABNORMAL HIGH (ref 1500–7800)
Platelets: 377 10*3/uL (ref 140–400)
RBC: 5.24 10*6/uL — ABNORMAL HIGH (ref 3.80–5.10)
RDW: 13.8 % (ref 11.0–15.0)
Total Lymphocyte: 22.9 %
WBC: 14.2 10*3/uL — ABNORMAL HIGH (ref 3.8–10.8)
WBCMIX: 923 {cells}/uL (ref 200–950)

## 2018-06-08 LAB — LIPID PANEL
CHOLESTEROL: 210 mg/dL — AB (ref <200)
HDL: 49 mg/dL — AB (ref >50)
LDL Cholesterol (Calc): 130 mg/dL (calc) — ABNORMAL HIGH
Non-HDL Cholesterol (Calc): 161 mg/dL (calc) — ABNORMAL HIGH (ref <130)
TRIGLYCERIDES: 179 mg/dL — AB (ref <150)
Total CHOL/HDL Ratio: 4.3 (calc) (ref <5.0)

## 2018-06-08 LAB — COMPLETE METABOLIC PANEL WITH GFR
AG RATIO: 1.9 (calc) (ref 1.0–2.5)
ALKALINE PHOSPHATASE (APISO): 98 U/L (ref 33–115)
ALT: 47 U/L — ABNORMAL HIGH (ref 6–29)
AST: 34 U/L — AB (ref 10–30)
Albumin: 4.8 g/dL (ref 3.6–5.1)
BILIRUBIN TOTAL: 0.4 mg/dL (ref 0.2–1.2)
BUN/Creatinine Ratio: 10 (calc) (ref 6–22)
BUN: 6 mg/dL — AB (ref 7–25)
CHLORIDE: 104 mmol/L (ref 98–110)
CO2: 27 mmol/L (ref 20–32)
Calcium: 10.1 mg/dL (ref 8.6–10.2)
Creat: 0.58 mg/dL (ref 0.50–1.10)
GFR, EST AFRICAN AMERICAN: 154 mL/min/{1.73_m2} (ref >=60)
GFR, Est Non African American: 133 mL/min/{1.73_m2} (ref >=60)
GLOBULIN: 2.5 g/dL (ref 1.9–3.7)
Glucose, Bld: 97 mg/dL (ref 65–99)
POTASSIUM: 3.9 mmol/L (ref 3.5–5.3)
Sodium: 140 mmol/L (ref 135–146)
Total Protein: 7.3 g/dL (ref 6.1–8.1)

## 2018-06-08 LAB — MAGNESIUM: Magnesium: 1.9 mg/dL (ref 1.5–2.5)

## 2018-06-08 LAB — MICROALBUMIN / CREATININE URINE RATIO
Creatinine, Urine: 247 mg/dL (ref 20–275)
Microalb Creat Ratio: 48 mcg/mg creat — ABNORMAL HIGH (ref <30)
Microalb, Ur: 11.8 mg/dL

## 2018-06-08 LAB — HEMOGLOBIN A1C
Hgb A1c MFr Bld: 5.4 % of total Hgb (ref <5.7)
Mean Plasma Glucose: 108 (calc)
eAG (mmol/L): 6 (calc)

## 2018-06-08 LAB — IRON, TOTAL/TOTAL IRON BINDING CAP
%SAT: 33 % (ref 16–45)
Iron: 116 ug/dL (ref 40–190)
TIBC: 352 ug/dL (ref 250–450)

## 2018-06-08 LAB — VITAMIN B12: VITAMIN B 12: 458 pg/mL (ref 200–1100)

## 2018-06-08 LAB — VITAMIN D 25 HYDROXY (VIT D DEFICIENCY, FRACTURES): Vit D, 25-Hydroxy: 27 ng/mL — ABNORMAL LOW (ref 30–100)

## 2018-06-08 LAB — TSH: TSH: 0.9 mIU/L

## 2018-06-08 NOTE — Addendum Note (Signed)
Addended by: Emerson Monte on: 06/08/2018 08:57 AM   Modules accepted: Orders

## 2018-06-09 LAB — URINE CULTURE
MICRO NUMBER:: 91312210
SPECIMEN QUALITY:: ADEQUATE

## 2018-06-12 ENCOUNTER — Other Ambulatory Visit: Payer: Self-pay

## 2018-06-12 DIAGNOSIS — F410 Panic disorder [episodic paroxysmal anxiety] without agoraphobia: Secondary | ICD-10-CM

## 2018-06-12 MED ORDER — BUSPIRONE HCL 10 MG PO TABS: 10.0000 mg | ORAL_TABLET | Freq: Two times a day (BID) | ORAL | 1 refills | Status: DC

## 2018-07-04 ENCOUNTER — Ambulatory Visit
Admission: RE | Admit: 2018-07-04 | Discharge: 2018-07-04 | Disposition: A | Discharge status: Home / Self Care | Payer: Self-pay | Source: Ambulatory Visit | Attending: Physician Assistant | Admitting: Physician Assistant

## 2018-07-04 DIAGNOSIS — R1011 Right upper quadrant pain: Secondary | ICD-10-CM

## 2018-07-04 DIAGNOSIS — K824 Cholesterolosis of gallbladder: Secondary | ICD-10-CM | POA: Diagnosis not present

## 2018-07-05 ENCOUNTER — Ambulatory Visit: Payer: Self-pay | Admitting: Physician Assistant

## 2018-07-20 DIAGNOSIS — T161XXA Foreign body in right ear, initial encounter: Secondary | ICD-10-CM | POA: Diagnosis not present

## 2018-07-24 NOTE — Progress Notes (Signed)
Subjective:    Patient ID: Patricia Davis, female    DOB: Jun 24, 1996, 22 y.o.   MRN: [id number]  HPI 22 y.o. obese WF presents for anxiety. Started on buspar with her effexor last visit. She states that the buspar was too strong and she was unable to cut it in half.   She has anxiety/agoraphobia, she states she has had panic attacks. She will have intrusive thoughts that she is in danger but she has had more physical symptoms with sweating, nausea, palpitations and muscles will tense. Can happen 5 x  In one day, state waves of panic. No diarrhea, vomiting. .   Dr. Valarie Conesancy the psychatrist. Was on zoloft before. She has valium for as needed but states it is too strong to function/drive with.   BMI is Body mass index is 34.77 kg/m., she is working on diet and exercise. Wt Readings from Last 3 Encounters:  07/25/18 245 lb 12.8 oz (111.5 kg)  06/07/18 242 lb 9.6 oz (110 kg)  11/17/17 240 lb (108.9 kg) (>99 %, Z= 2.42)*   * Growth percentiles are based on CDC (Girls, 2-20 Years) data.   Blood pressure 118/76, pulse 88, temperature 97.8 F (36.6 C), height 5' 10.5" (1.791 m), weight 245 lb 12.8 oz (111.5 kg), last menstrual period 07/11/2018, SpO2 97 %.    Medications Current Outpatient Medications on File Prior to Visit  Medication Sig  . aspirin EC 81 MG tablet Take 81 mg by mouth daily.  . Cholecalciferol (VITAMIN D PO) Take by mouth.  . venlafaxine XR (EFFEXOR-XR) 75 MG 24 hr capsule Take 3 capsules (225 mg total) by mouth daily with breakfast.  . vitamin B-12 (CYANOCOBALAMIN) 500 MCG tablet Take 500 mcg by mouth daily.  [employer]. XULANE 150-35 MCG/24HR transdermal patch APPLY ONE PATCH TOPICALLY ONCE A WEEK  . [DISCONTINUED] busPIRone (BUSPAR) 10 MG tablet Take 1 tablet (10 mg total) by mouth 2 (two) times daily. (Patient not taking: Reported on 07/25/2018)   No current facility-administered medications on file prior to visit.     Problem list She has Migraine without aura and  responsive to treatment; Episodic paroxysmal anxiety disorder; GERD (gastroesophageal reflux disease); Vitamin D deficiency; Obesity (BMI 30.0-34.9); Hyperlipidemia; and Elevated BP without diagnosis of hypertension on their problem list.  Review of Systems See HPI    Objective:   Physical Exam  General Appearance: Well nourished, in no apparent distress. Eyes: PERRLA, EOMs, conjunctiva no swelling or erythema Sinuses: No Frontal/maxillary tenderness ENT/Mouth: Ext aud canals clear, TMs without erythema, bulging. No erythema, swelling, or exudate on post pharynx.  Tonsils not swollen or erythematous. Hearing normal.  Neck: Supple, thyroid normal.  Respiratory: Respiratory effort normal, BS equal bilaterally without rales, rhonchi, wheezing or stridor.  Cardio: RRR with no MRGs. Brisk peripheral pulses without edema.  Abdomen: Soft, + BS,  + RUQ tenderness with + Murphys, no guarding, rebound, hernias, masses. Lymphatics: Non tender without lymphadenopathy.  Musculoskeletal: Full ROM, 5/5 strength, Normal gait Skin: Warm, dry without rashes, lesions, ecchymosis.  Neuro: Cranial nerves intact. Normal muscle tone, no cerebellar symptoms. Psych: Awake and oriented X 3, normal affect, Insight and Judgment appropriate.       Assessment & Plan:  Patricia Davis was seen today for follow-up.  Diagnoses and all orders for this visit:  Episodic paroxysmal anxiety disorder -     busPIRone (BUSPAR) 5 MG tablet; Take 1 tablet (5 mg total) by mouth 2 (two) times daily as needed (anxiety). - continue effexor  Right  upper quadrant pain -     NM Hepato W/EjeCT Fract; Future The patient was advised to call immediately if she has any concerning symptoms in the interval. The patient voices understanding of current treatment options and is in agreement with the current care plan.The patient knows to call the clinic with any problems, questions or concerns or go to the ER if any further progression of symptoms.        No future appointments.

## 2018-07-25 ENCOUNTER — Ambulatory Visit: Payer: 59 | Admitting: Physician Assistant

## 2018-07-25 ENCOUNTER — Encounter: Payer: Self-pay | Admitting: Physician Assistant

## 2018-07-25 VITALS — BP 118/76 | HR 88 | Temp 97.8°F | Ht 70.5 in | Wt 245.8 lb

## 2018-07-25 DIAGNOSIS — R1011 Right upper quadrant pain: Secondary | ICD-10-CM | POA: Diagnosis not present

## 2018-07-25 DIAGNOSIS — F410 Panic disorder [episodic paroxysmal anxiety] without agoraphobia: Secondary | ICD-10-CM

## 2018-07-25 MED ORDER — BUSPIRONE HCL 5 MG PO TABS: 5.0000 mg | ORAL_TABLET | Freq: Two times a day (BID) | ORAL | 0 refills | Status: DC | PRN

## 2018-07-25 NOTE — Patient Instructions (Signed)
Try the buspar 5mg  Can still try to cut in half if they are larger Can take 2-3 times a day If this does not help please contact me Continue effexor Start to work out again  Know what a healthy weight is for you (roughly BMI <25) and aim to maintain this  Aim for 7+ servings of fruits and vegetables daily  65-80+ fluid ounces of water or unsweet tea for healthy kidneys  Limit to max 1 drink of alcohol per day; avoid smoking/tobacco  Limit animal fats in diet for cholesterol and heart health - choose grass fed whenever available  Avoid highly processed foods, and foods high in saturated/trans fats  Aim for low stress - take time to unwind and care for your mental health  Aim for 150 min of moderate intensity exercise weekly for heart health, and weights twice weekly for bone health  Aim for 7-9 hours of sleep daily   Gallbladder Nuclear Scan A gallbladder nuclear scan (hepatobiliary scan or HIDA scan) is an imaging test that checks the function of your liver, your gallbladder, and the ducts of those organs. These parts make up your hepatobiliary system. You may need this scan if you have symptoms of liver or gallbladder disease. This scan is done with a camera that detects radioactive energy (gamma rays). For this exam, you will be given a radioactive substance, called a radiotracer, through an IV tube inserted in your hand or arm. As the radiotracer moves through your hepatobiliary system, the camera and a computer detect the gamma rays and form them into images that show how well your system is working. Tell a health care provider about:  Any possibility of pregnancy or if you are breastfeeding.  Any allergies you have.  All medicines you are taking, including vitamins, herbs, eye drops, creams, and over-the-counter medicines.  Any problems you or family members have had with anesthetic medicines.  Any blood disorders you have.  Any surgeries you have had.  Any medical  conditions you have. What happens before the procedure?  Take medicines only as directed by your health care provider.  Your health care provider will let you know when you should start fasting. You may not be able to eat or drink anything after midnight on the night before the procedure or for at least four hours before the test.  Do not wear jewelry to the exam.  Wear loose, comfortable clothing. You may be asked to put on a gown for the procedure. What happens during the procedure?  An IV tube will be inserted into a vein in your hand or arm. It will remain in place throughout the exam.  A small amount of the radiotracer material will be injected through your IV tube.  You may feel a cold sensation as the material runs through your IV tube.  While you are lying down, a technician will place the gamma camera over your abdomen.  The camera may rotate around your body. You may be asked to stay still or move into a certain position.  You will then be given a medicine called cholecystokinin (CCK) through your IV tube. This will make your gallbladder empty. You may feel nauseous or have cramping for a short time.  Additional images will be taken after CCK is given.  After all the images have been taken, your IV tube will be removed. What happens after the procedure?  You may resume normal activities and diet as directed by your health care provider.  The radiotracer will leave your body over the next few days. There are no long-term side effects from the radiotracer. Drink lots of fluids to help flush it out of your body.  A health care provider who specializes in nuclear medicine will read the scan and send a report to your regular health care provider. This information is not intended to replace advice given to you by your health care provider. Make sure you discuss any questions you have with your health care provider. Document Released: 07/23/2000 Document Revised: 01/01/2016  Document Reviewed: 11/28/2013 Elsevier Interactive Patient Education  [employer]2018 [employer] Inc[employer]

## 2018-08-14 ENCOUNTER — Encounter (HOSPITAL_COMMUNITY)
Admission: RE | Admit: 2018-08-14 | Discharge: 2018-08-14 | Disposition: A | Discharge status: Home / Self Care | Payer: 59 | Source: Ambulatory Visit | Attending: Physician Assistant | Admitting: Physician Assistant

## 2018-08-14 ENCOUNTER — Other Ambulatory Visit: Payer: Self-pay | Admitting: Physician Assistant

## 2018-08-14 ENCOUNTER — Other Ambulatory Visit: Payer: Self-pay | Admitting: Internal Medicine

## 2018-08-14 DIAGNOSIS — R112 Nausea with vomiting, unspecified: Secondary | ICD-10-CM | POA: Diagnosis not present

## 2018-08-14 DIAGNOSIS — R1011 Right upper quadrant pain: Secondary | ICD-10-CM | POA: Diagnosis not present

## 2018-08-14 DIAGNOSIS — F410 Panic disorder [episodic paroxysmal anxiety] without agoraphobia: Secondary | ICD-10-CM

## 2018-08-14 MED ORDER — TECHNETIUM TC 99M MEBROFENIN IV KIT
5.1000 | PACK | Freq: Once | INTRAVENOUS | Status: AC | PRN
  Administered 2018-08-14: 5.1 via INTRAVENOUS

## 2018-08-15 ENCOUNTER — Other Ambulatory Visit: Payer: Self-pay | Admitting: Physician Assistant

## 2018-08-15 DIAGNOSIS — F410 Panic disorder [episodic paroxysmal anxiety] without agoraphobia: Secondary | ICD-10-CM

## 2018-08-15 MED ORDER — BUSPIRONE HCL 5 MG PO TABS: ORAL_TABLET | ORAL | 1 refills | Status: DC

## 2018-08-15 MED ORDER — VENLAFAXINE HCL ER 75 MG PO CP24: 225.0000 mg | ORAL_CAPSULE | Freq: Every day | ORAL | 0 refills | Status: DC

## 2018-08-18 ENCOUNTER — Other Ambulatory Visit: Payer: Self-pay | Admitting: Physician Assistant

## 2018-08-18 DIAGNOSIS — R1110 Vomiting, unspecified: Secondary | ICD-10-CM

## 2018-08-18 DIAGNOSIS — R1011 Right upper quadrant pain: Secondary | ICD-10-CM

## 2018-08-24 ENCOUNTER — Ambulatory Visit: Payer: Self-pay | Admitting: Physician Assistant

## 2018-08-29 ENCOUNTER — Encounter: Payer: Self-pay | Admitting: Gastroenterology

## 2018-09-06 ENCOUNTER — Other Ambulatory Visit: Payer: 59

## 2018-09-06 ENCOUNTER — Encounter: Payer: Self-pay | Admitting: Gastroenterology

## 2018-09-06 ENCOUNTER — Ambulatory Visit: Payer: 59 | Admitting: Gastroenterology

## 2018-09-06 VITALS — BP 132/88 | Ht 70.0 in | Wt 243.0 lb

## 2018-09-06 DIAGNOSIS — K760 Fatty (change of) liver, not elsewhere classified: Secondary | ICD-10-CM

## 2018-09-06 DIAGNOSIS — R195 Other fecal abnormalities: Secondary | ICD-10-CM

## 2018-09-06 DIAGNOSIS — R7401 Elevation of levels of liver transaminase levels: Secondary | ICD-10-CM

## 2018-09-06 DIAGNOSIS — R112 Nausea with vomiting, unspecified: Secondary | ICD-10-CM | POA: Diagnosis not present

## 2018-09-06 DIAGNOSIS — R140 Abdominal distension (gaseous): Secondary | ICD-10-CM | POA: Diagnosis not present

## 2018-09-06 DIAGNOSIS — R740 Nonspecific elevation of levels of transaminase and lactic acid dehydrogenase [LDH]: Secondary | ICD-10-CM

## 2018-09-06 DIAGNOSIS — K589 Irritable bowel syndrome without diarrhea: Secondary | ICD-10-CM

## 2018-09-06 MED ORDER — SOD PICOSULFATE-MAG OX-CIT ACD 10-3.5-12 MG-GM -GM/160ML PO SOLN: 1.0000 | ORAL | 0 refills | Status: DC

## 2018-09-06 NOTE — Patient Instructions (Addendum)
If you are age 21 or older, your body mass index should be between 23-30. Your Body mass index is 34.87 kg/m. If this is out of the aforementioned range listed, please consider follow up with your Primary Care Provider.  If you are age 14 or younger, your body mass index should be between 19-25. Your Body mass index is 34.87 kg/m. If this is out of the aformentioned range listed, please consider follow up with your Primary Care Provider.   You have been scheduled for an endoscopy and colonoscopy. Please follow the written instructions given to you at your visit today. Please pick up your prep supplies at the pharmacy within the next 1-3 days. If you use inhalers (even only as needed), please bring them with you on the day of your procedure. Your physician has requested that you go to www.startemmi.com and enter the access code given to you at your visit today. This web site gives a general overview about your procedure. However, you should still follow specific instructions given to you by our office regarding your preparation for the procedure.  We have sent the following medications to your pharmacy for you to pick up at your convenience: Clenpiq  It was a pleasure to see you today!  Vito Cirigliano, D.O.

## 2018-09-06 NOTE — Progress Notes (Signed)
Chief Complaint: Abdominal pain, bloating, change in stools, nausea/vomiting  Referring Provider:     Quentin Mullingollier, Amanda, PA-C   HPI:     Patricia Davis is a 23 y.o. female with a history of anxiety referred to the [facility] for evaluation of multiple GI symptoms to include:  Hx of IBS diagnosed at age 89 per patient. Abdominal pain, nausea at that time without changes in bowel habits. Sxs improved for a few years, but have worsened lately. Mom states she had colic as a baby as well and early childhood anxiety with GI manifestations.   1) Abdominal pain: Right-sided mid abdominal pain that radiates to the back, shoulders, mid abdomen.   2) Bloating: Occasional, but not too bothersome.   3) Change in bowel habits: Endorses intermittently loose, non-bloody stools.  No constipation or straining.  Symptoms have been present for many years, improved, then have recurred recently.  Unclear if change in bowel habits associated with increased stress/anxiety.  4) Nausea/vomiting: Nausea can wake her with dry heaves- "need to get my stomach completely empty". Is sporadic, occurring 3-4 days, then recurs a few weeks later. x1 year. Sxs isolated to the AM. No hematemesis.   Sxs worse with dairy foods, but can also recur w/o dairy. No med trials. No prior EGD/colonoscopy.   Was evaluated by her PCM earlier this month with a negative/normal HIDA and essentially normal RUQ ultrasound (4 mm GB polyp, no stones, no duct dilation, increased echogenicity).  Effexor since 2015, started BuSpar for anxiety in 05/2018 and titrated earlier this month with clinical improvement.  Also improvement in GI sxs after starting Buspar, but with recurrence in last week or so.  Labs in 05/2018 notable for WBC 14.2 with otherwise normal CBC, elevated cholesterol, normal hemoglobin A1c, normal TSH, normal iron, normal B12, mildly elevated AST/ALT (34/47) which were previously normal in  11/2017.  Family history: MGF- CRC then Pancreatic CA Mat Aunt and MGGM: Meckel's diverticulum PGGM- CRC  Patient admits to smoking marijuana 4 times/week to nursing staff when mother was not in room, but did not want to discuss with me with mother present- will review this on f/u appt with her.   Past Medical History:  Diagnosis Date  . Anxiety   . Major depressive disorder 2014  . Pseudotumor cerebri 2010     Past Surgical History:  Procedure Laterality Date  . WISDOM TOOTH EXTRACTION  05/2014   Family History  Problem Relation Age of Onset  . Depression Mother   . Hypothyroidism Mother   . Gallbladder disease Mother   . Depression Father   . Gallbladder disease Father   . Pancreatic cancer Maternal Grandfather   . Colon cancer Maternal Grandfather   . Diverticulitis Maternal Great-grandmother   . Diverticulitis Maternal Aunt    Social History   Tobacco Use  . Smoking status: Never Smoker  . Smokeless tobacco: Never Used  Substance Use Topics  . Alcohol use: No    Alcohol/week: 0.0 standard drinks  . Drug use: Yes    Types: Marijuana    Comment: 3-4 times per week   Current Outpatient Medications  Medication Sig Dispense Refill  . aspirin EC 81 MG tablet Take 81 mg by mouth daily.    . busPIRone (BUSPAR) 5 MG tablet Take 1 tablet (5 mg total) by mouth 2 (two) times daily as needed (anxiety). 180 tablet 1  . Cholecalciferol (VITAMIN D PO) Take  by mouth.    . venlafaxine XR (EFFEXOR-XR) 75 MG 24 hr capsule Take 3 capsules (225 mg total) by mouth daily with breakfast. 270 capsule 0  . vitamin B-12 (CYANOCOBALAMIN) 500 MCG tablet Take 500 mcg by mouth daily.    [employer] XULANE 150-35 MCG/24HR transdermal patch APPLY ONE PATCH TOPICALLY ONCE A WEEK 12 patch 3   No current facility-administered medications for this visit.    Allergies  Allergen Reactions  . Sertraline     Other reaction(s): Laryngeal Edema (ALLERGY)  . Bactrim [Sulfamethoxazole-Trimethoprim] Other (See  Comments)    Nausea, yeast     Review of Systems: All systems reviewed and negative except where noted in HPI.     Physical Exam:    Wt Readings from Last 3 Encounters:  09/06/18 243 lb (110.2 kg)  07/25/18 245 lb 12.8 oz (111.5 kg)  06/07/18 242 lb 9.6 oz (110 kg)    Ht 5\' 10"  (1.778 m)   Wt 243 lb (110.2 kg)   BMI 34.87 kg/m  Constitutional:  Pleasant, in no acute distress. Psychiatric: Normal mood and affect. Behavior is normal. EENT: Pupils normal.  Conjunctivae are normal. No scleral icterus. Neck supple. No cervical LAD. Cardiovascular: Normal rate, regular rhythm. No edema Pulmonary/chest: Effort normal and breath sounds normal. No wheezing, rales or rhonchi. Abdominal: Soft, nondistended.  TTP in RUQ and right costal margin along with mild TTP in left mid abdomen.  No rebound or guarding. Bowel sounds active throughout. There are no masses palpable. No hepatomegaly. Neurological: Alert and oriented to person place and time. Skin: Skin is warm and dry. No rashes noted.   ASSESSMENT AND PLAN;   1) Nausea/vomiting 2) Abdominal pain 3) Change in bowel habits 4) Abdominal bloating Patricia AsaGrace Dunkel is a 23 y.o. female presenting with multiple GI symptoms which have been ongoing since childhood.  Clinical presentation seems most consistent with IBS, particularly given her underlying anxiety and some perceived response when adding BuSpar.  Given the long duration of symptoms and recently increased bothersome nature, she would like to proceed with further endoscopic evaluation to rule out concomitant disease such as PUD, gastritis, IBD, Microscopic Colitis, etc. will evaluate further as below:  - Stop Marijuana given chronic nausea/vomiting.  As patient did not want to discuss this in front of her mother today, we will plan on counseling in depth on follow-up appointment. - EGD/Colo to evaluate for mucosal/luminal etiology for symptoms - Low FODMAP diet - Resume Buspar,  Effexor - Had a long discussion with the patient and her mother regarding close overlap between IBS and anxiety.  Currently on 2 agents, so not planning on adding more medication for further neuromodulation.  5) Elevated AST/ALT: Labs in 05/2018 notable for mildly elevated AST/ALT with otherwise normal ALP and T bili.  Primarily so was normal.  Likely secondary to fatty infiltration given body habitus and RUQ ultrasound.  -Repeat LFTs to ensure normalization.  If still elevated/uptrending, can consider further serologic evaluation to rule out concomitant liver disease - Briefly discussed fatty liver disease with patient and her mother with plan for diet/exercise with modest 10% weight loss  6) Leukocytosis: Elevated WBC in 05/2017.  Repeating CBC today to ensure normalization  RTC in 3-6 months  The indications, risks, and benefits of EGD and colonoscopy were explained to the patient in detail. Risks include but are not limited to bleeding, perforation, adverse reaction to medications, and cardiopulmonary compromise. Sequelae include but are not limited to the possibility of surgery, hositalization,  and mortality. The patient verbalized understanding and wished to proceed. All questions answered, referred to scheduler and bowel prep ordered. Further recommendations pending results of the exam.   I spent a total of 45 minutes of face-to-face time with the patient. Greater than 50% of the time was spent counseling and coordinating care.    Shellia Cleverly, DO, FACG  09/06/2018, 11:10 AM   Quentin Mulling, PA-C

## 2018-09-21 ENCOUNTER — Encounter: Payer: Self-pay | Admitting: Gastroenterology

## 2018-09-21 ENCOUNTER — Ambulatory Visit (AMBULATORY_SURGERY_CENTER): Payer: 59 | Admitting: Gastroenterology

## 2018-09-21 VITALS — BP 118/64 | HR 76 | Temp 99.8°F | Resp 14 | Ht 70.0 in | Wt 243.0 lb

## 2018-09-21 DIAGNOSIS — K2970 Gastritis, unspecified, without bleeding: Secondary | ICD-10-CM

## 2018-09-21 DIAGNOSIS — R194 Change in bowel habit: Secondary | ICD-10-CM | POA: Diagnosis not present

## 2018-09-21 DIAGNOSIS — R1011 Right upper quadrant pain: Secondary | ICD-10-CM

## 2018-09-21 DIAGNOSIS — K589 Irritable bowel syndrome without diarrhea: Secondary | ICD-10-CM | POA: Diagnosis not present

## 2018-09-21 DIAGNOSIS — K529 Noninfective gastroenteritis and colitis, unspecified: Secondary | ICD-10-CM | POA: Diagnosis not present

## 2018-09-21 DIAGNOSIS — R197 Diarrhea, unspecified: Secondary | ICD-10-CM | POA: Diagnosis not present

## 2018-09-21 DIAGNOSIS — R112 Nausea with vomiting, unspecified: Secondary | ICD-10-CM | POA: Diagnosis not present

## 2018-09-21 DIAGNOSIS — R140 Abdominal distension (gaseous): Secondary | ICD-10-CM | POA: Diagnosis not present

## 2018-09-21 DIAGNOSIS — K2950 Unspecified chronic gastritis without bleeding: Secondary | ICD-10-CM | POA: Diagnosis not present

## 2018-09-21 MED ORDER — SODIUM CHLORIDE 0.9 % IV SOLN: 500.0000 mL | Freq: Once | INTRAVENOUS | Status: DC

## 2018-09-21 NOTE — Op Note (Signed)
[facility] Patient Name: Patricia Davis Procedure Date: 09/21/2018 12:33 PM MRN: [id number] Endoscopist: Doristine Locks , MD Age: 23 Referring MD:  Date of Birth: February 16, 1996 Gender: Female Account #: [id number] Procedure:                Colonoscopy Indications:              Abdominal pain in the right upper quadrant, Change                            in bowel habits, Diarrhea Medicines:                Monitored Anesthesia Care Procedure:                Pre-Anesthesia Assessment:                           - Prior to the procedure, a History and Physical                            was performed, and patient medications and                            allergies were reviewed. The patient's tolerance of                            previous anesthesia was also reviewed. The risks                            and benefits of the procedure and the sedation                            options and risks were discussed with the patient.                            All questions were answered, and informed consent                            was obtained. Prior Anticoagulants: The patient has                            taken no previous anticoagulant or antiplatelet                            agents. ASA Grade Assessment: II - A patient with                            mild systemic disease. After reviewing the risks                            and benefits, the patient was deemed in                            satisfactory condition to undergo the procedure.  After obtaining informed consent, the colonoscope                            was passed under direct vision. Throughout the                            procedure, the patient's blood pressure, pulse, and                            oxygen saturations were monitored continuously. The                            Colonoscope was introduced through the anus and                            advanced to the the terminal  ileum. The colonoscopy                            was performed without difficulty. The patient                            tolerated the procedure well. The quality of the                            bowel preparation was adequate. The terminal ileum,                            ileocecal valve, appendiceal orifice, and rectum                            were photographed. Scope In: 1:41:51 PM Scope Out: 1:55:32 PM Scope Withdrawal Time: 0 hours 11 minutes 52 seconds  Total Procedure Duration: 0 hours 13 minutes 41 seconds  Findings:                 The perianal and digital rectal examinations were                            normal.                           A localized area of mildly erythematous mucosa was                            found in the distal sigmoid colon, located 22-30 cm                            from the anl verge. There were no associated ulcers                            or erosions. Biopsies were taken with a cold                            forceps for histology. Estimated blood loss was  minimal.                           The rectum, recto-sigmoid colon, proximal sigmoid                            colon, mid sigmoid colon, descending colon,                            transverse colon, ascending colon and cecum all                            otherwise appeared normal. Biopsies for histology                            were taken with a cold forceps from the right colon                            and left colon for evaluation of microscopic                            colitis. Estimated blood loss was minimal.                           A localized area of mucosa in the terminal ileum                            was mildly erythematous, located within 3-4 cm of                            the IC valve. There were no ulcers or erosions. The                            ileum was otherwise normal appearing up to 10 cm                            from the  IC valve. The affected area was biopsied                            with a cold forceps for histology. Estimated blood                            loss was minimal.                           Anal papilla(e) were hypertrophied. Complications:            No immediate complications. Estimated Blood Loss:     Estimated blood loss was minimal. Impression:               - Localized area of mildly erythematous,                            non-ulcerated mucosa in the distal sigmoid colon.  Biopsied.                           - The rectum, recto-sigmoid colon, proximal sigmoid                            colon, mid sigmoid colon, descending colon,                            transverse colon, ascending colon and cecum are                            normal. Biopsied.                           - Localized area of mildly erythematous mucosa in                            the terminal ileum. Biopsied.                           - Anal papilla(e) were hypertrophied. Recommendation:           - Patient has a contact number available for                            emergencies. The signs and symptoms of potential                            delayed complications were discussed with the                            patient. Return to normal activities tomorrow.                            Written discharge instructions were provided to the                            patient.                           - Resume previous diet today.                           - Continue present medications.                           - Use fiber, for example Citrucel, Fibercon, Konsyl                            or Metamucil.                           - Await pathology results.                           - Repeat colonoscopy at age 16.                           -  Return to GI clinic PRN. Doristine Locks, MD 09/21/2018 2:10:30 PM

## 2018-09-21 NOTE — Patient Instructions (Signed)
Thank you for allowing [place] to care for you today!  Await pathology results by mail, approximately 2 weeks.  Recommend daily fiber (Metamucil, Fiber-Con etc) available OTC at local pharmacies.  Resume previous diet and medications today,  Return to normal activities tomorrow.    YOU HAD AN ENDOSCOPIC PROCEDURE TODAY AT THE [facility]:   Refer to the procedure report that was given to you for any specific questions about what was found during the examination.  If the procedure report does not answer your questions, please call your gastroenterologist to clarify.  If you requested that your care partner not be given the details of your procedure findings, then the procedure report has been included in a sealed envelope for you to review at your convenience later.  YOU SHOULD EXPECT: Some feelings of bloating in the abdomen. Passage of more gas than usual.  Walking can help get rid of the air that was put into your GI tract during the procedure and reduce the bloating. If you had a lower endoscopy (such as a colonoscopy or flexible sigmoidoscopy) you may notice spotting of blood in your stool or on the toilet paper. If you underwent a bowel prep for your procedure, you may not have a normal bowel movement for a few days.  Please Note:  You might notice some irritation and congestion in your nose or some drainage.  This is from the oxygen used during your procedure.  There is no need for concern and it should clear up in a day or so.  SYMPTOMS TO REPORT IMMEDIATELY:   Following lower endoscopy (colonoscopy or flexible sigmoidoscopy):  Excessive amounts of blood in the stool  Significant tenderness or worsening of abdominal pains  Swelling of the abdomen that is new, acute  Fever of 100F or higher   Following upper endoscopy (EGD)  Vomiting of blood or coffee ground material  New chest pain or pain under the shoulder blades  Painful or persistently difficult swallowing  New  shortness of breath  Fever of 100F or higher  Black, tarry-looking stools  For urgent or emergent issues, a gastroenterologist can be reached at any hour by calling [phone].   DIET:  We do recommend a small meal at first, but then you may proceed to your regular diet.  Drink plenty of fluids but you should avoid alcoholic beverages for 24 hours.  ACTIVITY:  You should plan to take it easy for the rest of today and you should NOT DRIVE or use heavy machinery until tomorrow (because of the sedation medicines used during the test).    FOLLOW UP: Our staff will call the number listed on your records the next business day following your procedure to check on you and address any questions or concerns that you may have regarding the information given to you following your procedure. If we do not reach you, we will leave a message.  However, if you are feeling well and you are not experiencing any problems, there is no need to return our call.  We will assume that you have returned to your regular daily activities without incident.  If any biopsies were taken you will be contacted by phone or by letter within the next 1-3 weeks.  Please call us at [phone] if you have not heard about the biopsies in 3 weeks.    SIGNATURES/CONFIDENTIALITY: You and/or your care partner have signed paperwork which will be entered into your electronic medical record.  These signatures attest  to the fact that that the information above on your After Visit Summary has been reviewed and is understood.  Full responsibility of the confidentiality of this discharge information lies with you and/or your care-partner. 

## 2018-09-21 NOTE — Progress Notes (Signed)
To PACU, VSS. lReport to [email or web address]

## 2018-09-21 NOTE — Op Note (Signed)
[facility] Patient Name: Patricia Davis Procedure Date: 09/21/2018 12:32 PM MRN: [id number] Endoscopist: Doristine Locks , MD Age: 23 Referring MD:  Date of Birth: [birth year] Gender: Female Account #: [id number] Procedure:                Upper GI endoscopy Indications:              Abdominal pain in the right upper quadrant,                            Abdominal bloating, Diarrhea, Nausea with vomiting Medicines:                Monitored Anesthesia Care Procedure:                Pre-Anesthesia Assessment:                           - Prior to the procedure, a History and Physical                            was performed, and patient medications and                            allergies were reviewed. The patient's tolerance of                            previous anesthesia was also reviewed. The risks                            and benefits of the procedure and the sedation                            options and risks were discussed with the patient.                            All questions were answered, and informed consent                            was obtained. Prior Anticoagulants: The patient has                            taken no previous anticoagulant or antiplatelet                            agents. ASA Grade Assessment: II - A patient with                            mild systemic disease. After reviewing the risks                            and benefits, the patient was deemed in                            satisfactory condition to undergo the procedure.  After obtaining informed consent, the endoscope was                            passed under direct vision. Throughout the                            procedure, the patient's blood pressure, pulse, and                            oxygen saturations were monitored continuously. The                            Endoscope was introduced through the mouth, and   advanced to the second part of duodenum. The upper                            GI endoscopy was accomplished without difficulty.                            The patient tolerated the procedure well. Scope In: Scope Out: Findings:                 The examined esophagus was normal.                           Esophagogastric landmarks were identified: the                            Z-line was found at 41 cm, the gastroesophageal                            junction was found at 41 cm and the site of hiatal                            narrowing was found at 41 cm from the incisors.                           The gastroesophageal flap valve was visualized                            endoscopically and classified as Hill Grade II                            (fold present, opens with respiration).                           The entire examined stomach was normal. Biopsies                            were taken with a cold forceps for Helicobacter                            pylori testing. Estimated blood loss was minimal.  The duodenal bulb, first portion of the duodenum                            and second portion of the duodenum were normal.                            Biopsies for histology were taken with a cold                            forceps for evaluation of celiac disease. Estimated                            blood loss was minimal. Complications:            No immediate complications. Estimated Blood Loss:     Estimated blood loss was minimal. Impression:               - Normal esophagus.                           - Esophagogastric landmarks identified.                           - Gastroesophageal flap valve classified as Hill                            Grade II (fold present, opens with respiration).                           - Normal stomach. Biopsied.                           - Normal duodenal bulb, first portion of the                            duodenum and  second portion of the duodenum.                            Biopsied. Recommendation:           - Patient has a contact number available for                            emergencies. The signs and symptoms of potential                            delayed complications were discussed with the                            patient. Return to normal activities tomorrow.                            Written discharge instructions were provided to the                            patient.                           -  Resume previous diet today.                           - Continue present medications.                           - Await pathology results.                           - Return to GI clinic PRN. Doristine Locks, MD 09/21/2018 2:02:46 PM

## 2018-09-22 ENCOUNTER — Telehealth: Payer: Self-pay | Admitting: *Deleted

## 2018-09-22 NOTE — Telephone Encounter (Signed)
  Follow up Call-  Call back number 09/21/2018  Post procedure Call Back phone  # [phone]  Permission to leave phone message Yes  Some recent data might be hidden    Spoke with mother Patient questions:  Do you have a fever, pain , or abdominal swelling? No. Pain Score  0 *  Have you tolerated food without any problems? Yes.    Have you been able to return to your normal activities? Yes.    Do you have any questions about your discharge instructions: Diet   No. Medications  No. Follow up visit  No.  Do you have questions or concerns about your Care? No.  Actions: * If pain score is 4 or above: No action needed, pain <4.

## 2018-09-28 ENCOUNTER — Encounter: Payer: Self-pay | Admitting: Gastroenterology

## 2018-10-18 ENCOUNTER — Encounter: Payer: Self-pay | Admitting: Physician Assistant

## 2018-11-14 NOTE — Progress Notes (Signed)
Assessment and Plan:   Encounter for general adult medical examination with abnormal findings 1 year Due PAP next year  Episodic paroxysmal anxiety disorder/depression -     vortioxetine HBr (TRINTELLIX) 10 MG TABS tablet; Take 1 tablet (10 mg total) by mouth daily. - add low dose to the effexor - discussed serotonin syndrome possibility- will monitor for symptoms - phone call follow up 1 month  Migraine without aura and responsive to treatment On effexor  Gastroesophageal reflux disease without esophagitis Continue PPI/H2 blocker, diet discussed  Mixed hyperlipidemia -     Lipid panel check lipids decrease fatty foods increase activity.   Elevated BP without diagnosis of hypertension - continue medications, DASH diet, exercise and monitor at home. Call if greater than 130/80.  -     CBC with Differential/Platelet -     COMPLETE METABOLIC PANEL WITH GFR -     TSH -     Urinalysis, Routine w reflex microscopic -     Microalbumin / creatinine urine ratio  Obesity (BMI 30.0-34.9) - follow up 3 months for progress monitoring - increase veggies, decrease carbs - long discussion about weight loss, diet, and exercise  Vitamin D deficiency -     VITAMIN D 25 Hydroxy (Vit-D Deficiency, Fractures)  Medication management -     Magnesium  Screening, anemia, deficiency, iron -     Vitamin B12 -     Iron,Total/Total Iron Binding Cap   Continue diet and meds as discussed. Further disposition pending results of labs.  HPI 23 y.o. female  presents for a physical.    Her blood pressure has been controlled at home, today their BP is BP: 128/88   She started on effexor July 2014, she has buspar if she needs it but has not been taking it lately. She has had klonopin, xanax, valium. Has tried lexapro, zoloft.  She is learning to deal with her anxiety. She is still working at [employer] going into work, she is painting and keeping busy otherwise.   She has been having nausea,  vomiting and AB pain. Had negative AB [place] other than fatty liver, had negative HIDA scan and has seen GI. She is doing okay with it, trying to follow FODMAP   She does workout, it stretching in the morning she is trying to get out and walk. She denies chest pain, shortness of breath, dizziness. At [employer], and will transfer to [employer].   BMI is Body mass index is 33.95 kg/m., she is working on diet and exercise. On the patch, likes it, menses are regular.  Has family history of PCOS.  Wt Readings from Last 3 Encounters:  11/15/18 240 lb (108.9 kg)  09/21/18 243 lb (110.2 kg)  09/06/18 243 lb (110.2 kg)    She is not on cholesterol medication and denies myalgias. Her cholesterol is not at goal. The cholesterol last visit was:   Lab Results  Component Value Date   CHOL 210 (H) 06/07/2018   HDL 49 (L) 06/07/2018   LDLCALC 130 (H) 06/07/2018   TRIG 179 (H) 06/07/2018   CHOLHDL 4.3 06/07/2018    . Last A1C in the office was:  Lab Results  Component Value Date   HGBA1C 5.4 06/07/2018   Patient is on Vitamin D supplement.   Lab Results  Component Value Date   VD25OH 27 (L) 06/07/2018        Current Medications:  Current Outpatient Medications on File Prior to Visit  Medication Sig  .  aspirin EC 81 MG tablet Take 81 mg by mouth daily.  . busPIRone (BUSPAR) 5 MG tablet Take 1 tablet (5 mg total) by mouth 2 (two) times daily as needed (anxiety).  . Cholecalciferol (VITAMIN D PO) Take by mouth.  . venlafaxine XR (EFFEXOR-XR) 75 MG 24 hr capsule Take 3 capsules (225 mg total) by mouth daily with breakfast.  . vitamin B-12 (CYANOCOBALAMIN) 500 MCG tablet Take 500 mcg by mouth daily.  Burr Medico 150-35 MCG/24HR transdermal patch APPLY ONE PATCH TOPICALLY ONCE A WEEK   No current facility-administered medications on file prior to visit.    Medical History:  Past Medical History:  Diagnosis Date  . Anxiety   . Major depressive disorder 2014  . Pseudotumor cerebri 2010    TDAP 2010 HPV vaccines- will start Up to date on rest- see records from peds PAP age 68  Review of Systems:  Review of Systems  Constitutional: Negative.   HENT: Negative.   Eyes: Negative.   Respiratory: Negative.   Cardiovascular: Negative for chest pain, palpitations, orthopnea, claudication, leg swelling and PND.  Gastrointestinal: Negative.   Genitourinary: Negative.  Negative for frequency.  Musculoskeletal: Negative.   Skin: Negative.   Neurological: Negative.  Negative for dizziness.  Psychiatric/Behavioral: Negative for depression, hallucinations, memory loss, substance abuse and suicidal ideas. The patient is not nervous/anxious (improved with effexor) and does not have insomnia.    Allergies Allergies  Allergen Reactions  . Sertraline     Other reaction(s): Laryngeal Edema (ALLERGY)  . Bactrim [Sulfamethoxazole-Trimethoprim] Other (See Comments)    Nausea, yeast    SURGICAL HISTORY She  has a past surgical history that includes Wisdom tooth extraction (05/2014). FAMILY HISTORY Her family history includes Colon cancer in her maternal grandfather; Depression in her father and mother; Diverticulitis in her maternal aunt and maternal great-grandmother; Gallbladder disease in her father and mother; Hypothyroidism in her mother; Pancreatic cancer in her maternal grandfather. SOCIAL HISTORY She  reports that she has never smoked. She has never used smokeless tobacco. She reports current drug use. Drug: Marijuana. She reports that she does not drink alcohol.  Physical Exam: BP 128/88   Pulse 84   Temp (!) 97.4 F (36.3 C)   Ht 5' 10.5" (1.791 m)   Wt 240 lb (108.9 kg)   LMP 11/09/2018   SpO2 97%   BMI 33.95 kg/m  Wt Readings from Last 3 Encounters:  11/15/18 240 lb (108.9 kg)  09/21/18 243 lb (110.2 kg)  09/06/18 243 lb (110.2 kg)   General Appearance: Well nourished, in no apparent distress. Eyes: PERRLA, EOMs, conjunctiva no swelling or erythema Sinuses:  No Frontal/maxillary tenderness ENT/Mouth: Ext aud canals clear, TMs without erythema, bulging. No erythema, swelling, or exudate on post pharynx.  Tonsils not swollen or erythematous. Hearing normal.  Neck: Supple, thyroid normal.  Respiratory: Respiratory effort normal, BS equal bilaterally without rales, rhonchi, wheezing or stridor.  Cardio: RRR with no MRGs. Brisk peripheral pulses without edema.  Abdomen: Soft, + BS,  Non tender, no guarding, rebound, hernias, masses. Lymphatics: Non tender without lymphadenopathy.  Musculoskeletal: Full ROM, 5/5 strength, Normal gait Skin: Warm, dry without rashes, lesions, ecchymosis.  Neuro: Cranial nerves intact. Normal muscle tone, no cerebellar symptoms. Psych: Awake and oriented X 3, normal affect, Insight and Judgment appropriate.    Quentin Mulling, PA-C 9:41 AM [facility]

## 2018-11-15 ENCOUNTER — Encounter: Payer: Self-pay | Admitting: Physician Assistant

## 2018-11-15 ENCOUNTER — Other Ambulatory Visit: Payer: Self-pay

## 2018-11-15 ENCOUNTER — Ambulatory Visit: Payer: 59 | Admitting: Physician Assistant

## 2018-11-15 VITALS — BP 128/88 | HR 84 | Temp 97.4°F | Ht 70.5 in | Wt 240.0 lb

## 2018-11-15 DIAGNOSIS — Z0000 Encounter for general adult medical examination without abnormal findings: Secondary | ICD-10-CM | POA: Diagnosis not present

## 2018-11-15 DIAGNOSIS — Z130 Encounter for screening for diseases of the blood and blood-forming organs and certain disorders involving the immune mechanism: Secondary | ICD-10-CM

## 2018-11-15 DIAGNOSIS — G43009 Migraine without aura, not intractable, without status migrainosus: Secondary | ICD-10-CM

## 2018-11-15 DIAGNOSIS — E782 Mixed hyperlipidemia: Secondary | ICD-10-CM

## 2018-11-15 DIAGNOSIS — F410 Panic disorder [episodic paroxysmal anxiety] without agoraphobia: Secondary | ICD-10-CM

## 2018-11-15 DIAGNOSIS — R030 Elevated blood-pressure reading, without diagnosis of hypertension: Secondary | ICD-10-CM

## 2018-11-15 DIAGNOSIS — Z79899 Other long term (current) drug therapy: Secondary | ICD-10-CM | POA: Diagnosis not present

## 2018-11-15 DIAGNOSIS — K219 Gastro-esophageal reflux disease without esophagitis: Secondary | ICD-10-CM

## 2018-11-15 DIAGNOSIS — E669 Obesity, unspecified: Secondary | ICD-10-CM

## 2018-11-15 DIAGNOSIS — E559 Vitamin D deficiency, unspecified: Secondary | ICD-10-CM

## 2018-11-15 DIAGNOSIS — Z0001 Encounter for general adult medical examination with abnormal findings: Secondary | ICD-10-CM

## 2018-11-15 MED ORDER — VORTIOXETINE HBR 10 MG PO TABS: 10.0000 mg | ORAL_TABLET | Freq: Every day | ORAL | 1 refills | Status: DC

## 2018-11-15 NOTE — Patient Instructions (Signed)
ALLERGY MEDICATIONS OVER THE COUNTER  Please pick one of the over the counter allergy medications below and take it once daily for allergies.  Claritin or loratadine cheapest but likely the weakest  Zyrtec or certizine at night because it can make you sleepy The strongest is allegra or fexafinadine  Cheapest at [employer], [employer], [employer]  Flonase, nasonex, nasocort Can do a steroid nasal spary 1-2 sparys at night each nostril.  Remember to spray each nostril twice towards the outer part of your eye.   Do not sniff but instead pinch your nose and tilt your head back to help the medicine get into your sinuses.   The best time to do this is at bedtime.  Stop if you get blurred vision or nose bleeds.   THIS WILL TAKE 7 DAYS TO WORK AND IS BETTER IF YOU START BEFORE SYMPTOMS SO IF YOU HAVE A SEASON OR TIME OF THE YEAR YOU ALWAYS GET A COLD, START BEFORE THAT!  Going to add on trintellix 5 mg for 1-2 weeks, you can stay on the 5 mg or move up to the 10 mg and stay on this dose. This is an add on to your effexor.   Worst case to adding on the trintellix is something called serotonin syndrome Please be aware that some of the medications that you are on can sometimes cause a rare and potentially dangerous adverse reaction, called SEROTONIN SYNDROME: Symptoms of this condition include (but are not limited to):  Agitation or restlessness, confusion, rapid heart rate and high blood pressure, dilated pupils, loss of muscle coordination or twitching muscles, muscle rigidity/stiffness, sweating and/or flushing, diarrhea, headache, shivering, goose bumps. If you have any of these symptoms you may have to stop the medication. Call your health care provider immediately.  Severe serotonin syndrome can be life-threatening emergency. Signs and symptoms of a severe reaction may include: high fever, seizures, irregular heartbeat, unconsciousness or altered level of awareness or personality changes.  If you have any of  these new symptoms, call 911 or have someone take you to the emergency room.     Know what a healthy weight is for you (roughly BMI <25) and aim to maintain this  Aim for 7+ servings of fruits and vegetables daily  65-80+ fluid ounces of water or unsweet tea for healthy kidneys  Limit to max 1 drink of alcohol per day; avoid smoking/tobacco  Limit animal fats in diet for cholesterol and heart health - choose grass fed whenever available  Avoid highly processed foods, and foods high in saturated/trans fats  Aim for low stress - take time to unwind and care for your mental health  Aim for 150 min of moderate intensity exercise weekly for heart health, and weights twice weekly for bone health  Aim for 7-9 hours of sleep daily    Please go to the ER if you have any severe AB pain, unable to hold down food/water, blood in stool or vomit, chest pain, shortness of breath, or any worsening symptoms.   Consider keeping a food diary- common causes of diarrhea are dairy, certain carbs...  FODMAP stands for fermentable oligo-, di-, mono-saccharides and polyols (1). These are the scientific terms used to classify groups of carbs that are notorious for triggering digestive symptoms like bloating, gas and stomach pain.   FODMAPs are found in a wide range of foods in varying amounts. Some foods contain just one type, while others contain several.  The main dietary sources of the four groups  of FODMAPs include:  Oligosaccharides: Wheat, rye, legumes and various fruits and vegetables, such as garlic and onions.  Disaccharides: Milk, yogurt and soft cheese. Lactose is the main carb.  Monosaccharides: Various fruit including figs and mangoes, and sweeteners such as honey and agave nectar. Fructose is the main carb.  Polyols: Certain fruits and vegetables including blackberries and lychee, as well as some low-calorie sweeteners like those in sugar-free gum.   Keep a food diary. This will  help you identify foods that cause symptoms. Write down: ? What you eat and when. ? What symptoms you have. ? When symptoms occur in relation to your meals.  Avoid foods that cause symptoms. Talk with your dietitian about other ways to get the same nutrients that are in these foods.  Eat your meals slowly, in a relaxed setting.  Aim to eat 5-6 small meals per day. Do not skip meals.  Drink enough fluids to keep your urine clear or pale yellow.  Ask your health care provider if you should take an over-the-counter probiotic during flare-ups to help restore healthy gut bacteria.  If you have cramping or diarrhea, try making your meals low in fat and high in carbohydrates. Examples of carbohydrates are pasta, rice, whole grain breads and cereals, fruits, and vegetables.  If dairy products cause your symptoms to flare up, try eating less of them. You might be able to handle yogurt better than other dairy products because it contains bacteria that help with digestion.    Vortioxetine oral tablet What is this medicine? Vortioxetine (vor tee [employer] e teen) is used to treat depression/anxiety. This medicine may be used for other purposes; ask your health care provider or pharmacist if you have questions. COMMON BRAND NAME(S): BRINTELLIX, Trintellix What should I tell my health care provider before I take this medicine? They need to know if you have any of these conditions: -bipolar disorder or a family history of bipolar disorder -bleeding disorders -drink alcohol -glaucoma -liver disease -low levels of sodium in the blood -seizures -suicidal thoughts, plans, or attempt; a previous suicide attempt by you or a family member -take medicines that treat or prevent blood clots -an unusual or allergic reaction to vortioxetine, other medicines, foods, dyes, or preservatives -pregnant or trying to get pregnant -breast-feeding How should I use this medicine? Take this medicine by mouth with a  glass of water. Follow the directions on the prescription label. You can take it with or without food. If it upsets your stomach, take it with food. Take your medicine at regular intervals. Do not take it more often than directed. Do not stop taking this medicine suddenly except upon the advice of your doctor. Stopping this medicine too quickly may cause serious side effects or your condition may worsen. A special MedGuide will be given to you by the pharmacist with each prescription and refill. Be sure to read this information carefully each time. Talk to your pediatrician regarding the use of this medicine in children. Special care may be needed. Overdosage: If you think you have taken too much of this medicine contact a poison control center or emergency room at once. NOTE: This medicine is only for you. Do not share this medicine with others. What if I miss a dose? If you miss a dose, take it as soon as you can. If it is almost time for your next dose, take only that dose. Do not take double or extra doses. What may interact with this medicine? Do not take  this medicine with any of the following medications: -linezolid -MAOIs like Carbex, Eldepryl, Marplan, Nardil, and Parnate -methylene blue (injected into a vein) This medicine may also interact with the following medications: -alcohol -aspirin and aspirin-like medicines -carbamazepine -certain medicines for depression, anxiety, or psychotic disturbances -certain medicines for migraine headache like almotriptan, eletriptan, frovatriptan, naratriptan, rizatriptan, sumatriptan, zolmitriptan -diuretics -fentanyl -furazolidone -isoniazid -medicines that treat or prevent blood clots like warfarin, enoxaparin, and dalteparin -NSAIDs, medicines for pain and inflammation, like ibuprofen or naproxen -phenytoin -procarbazine -quinidine -rasagiline -rifampin -supplements like St. John's wort, kava kava, valerian -tramadol -tryptophan This  list may not describe all possible interactions. Give your health care provider a list of all the medicines, herbs, non-prescription drugs, or dietary supplements you use. Also tell them if you smoke, drink alcohol, or use illegal drugs. Some items may interact with your medicine. What should I watch for while using this medicine? Tell your doctor if your symptoms do not get better or if they get worse. Visit your doctor or health care professional for regular checks on your progress. Because it may take several weeks to see the full effects of this medicine, it is important to continue your treatment as prescribed by your doctor. Patients and their families should watch out for new or worsening thoughts of suicide or depression. Also watch out for sudden changes in feelings such as feeling anxious, agitated, panicky, irritable, hostile, aggressive, impulsive, severely restless, overly excited and hyperactive, or not being able to sleep. If this happens, especially at the beginning of treatment or after a change in dose, call your health care professional. Bonita Quin may get drowsy or dizzy. Do not drive, use machinery, or do anything that needs mental alertness until you know how this medicine affects you. Do not stand or sit up quickly, especially if you are an older patient. This reduces the risk of dizzy or fainting spells. Alcohol may interfere with the effect of this medicine. Avoid alcoholic drinks. Your mouth may get dry. Chewing sugarless gum or sucking hard candy, and drinking plenty of water may help. Contact your doctor if the problem does not go away or is severe. What side effects may I notice from receiving this medicine? Side effects that you should report to your doctor or health care professional as soon as possible: -allergic reactions like skin rash, itching or hives, swelling of the face, lips, or tongue -anxious -black, tarry stools -changes in vision -confusion -elevated mood, decreased  need for sleep, racing thoughts, impulsive behavior -eye pain -fast, irregular heartbeat -feeling faint or lightheaded, falls -feeling agitated, angry, or irritable -hallucination, loss of contact with reality -loss of balance or coordination -loss of memory -painful or prolonged erections -restlessness, pacing, inability to keep still -seizures -stiff muscles -suicidal thoughts or other mood changes -trouble sleeping -unusual bleeding or bruising -unusually weak or tired -vomiting Side effects that usually do not require medical attention (report to your doctor or health care professional if they continue or are bothersome): -change in appetite or weight -change in sex drive or performance -constipation -dizziness -dry mouth -nausea This list may not describe all possible side effects. Call your doctor for medical advice about side effects. You may report side effects to FDA at 1-800-FDA-1088. Where should I keep my medicine? Keep out of the reach of children. Store at room temperature between 15 and 30 degrees C (59 and 86 degrees F). Throw away any unused medicine after the expiration date. NOTE: This sheet is a summary. It may  not cover all possible information. If you have questions about this medicine, talk to your doctor, pharmacist, or health care provider.  2019 Elsevier/Gold Standard (2015-12-25 16:45:13)

## 2018-11-16 LAB — CBC WITH DIFFERENTIAL/PLATELET
Absolute Monocytes: 678 cells/uL (ref 200–950)
Basophils Absolute: 48 cells/uL (ref 0–200)
Basophils Relative: 0.4 %
Eosinophils Absolute: 581 cells/uL — ABNORMAL HIGH (ref 15–500)
Eosinophils Relative: 4.8 %
HCT: 44.5 % (ref 35.0–45.0)
Hemoglobin: 15 g/dL (ref 11.7–15.5)
Lymphs Abs: 3364 cells/uL (ref 850–3900)
MCH: 28.9 pg (ref 27.0–33.0)
MCHC: 33.7 g/dL (ref 32.0–36.0)
MCV: 85.7 fL (ref 80.0–100.0)
MPV: 11.1 fL (ref 7.5–12.5)
Monocytes Relative: 5.6 %
Neutro Abs: 7429 cells/uL (ref 1500–7800)
Neutrophils Relative %: 61.4 %
Platelets: 371 10*3/uL (ref 140–400)
RBC: 5.19 10*6/uL — ABNORMAL HIGH (ref 3.80–5.10)
RDW: 13.7 % (ref 11.0–15.0)
Total Lymphocyte: 27.8 %
WBC: 12.1 10*3/uL — ABNORMAL HIGH (ref 3.8–10.8)

## 2018-11-16 LAB — COMPLETE METABOLIC PANEL WITH GFR
AG Ratio: 1.6 (calc) (ref 1.0–2.5)
ALT: 13 U/L (ref 6–29)
AST: 16 U/L (ref 10–30)
Albumin: 4.6 g/dL (ref 3.6–5.1)
Alkaline phosphatase (APISO): 83 U/L (ref 31–125)
BUN: 9 mg/dL (ref 7–25)
CO2: 22 mmol/L (ref 20–32)
Calcium: 9.9 mg/dL (ref 8.6–10.2)
Chloride: 104 mmol/L (ref 98–110)
Creat: 0.73 mg/dL (ref 0.50–1.10)
GFR, Est African American: 137 mL/min/{1.73_m2} (ref >=60)
GFR, Est Non African American: 119 mL/min/{1.73_m2} (ref >=60)
Globulin: 2.8 g/dL (calc) (ref 1.9–3.7)
Glucose, Bld: 96 mg/dL (ref 65–99)
Potassium: 4 mmol/L (ref 3.5–5.3)
Sodium: 138 mmol/L (ref 135–146)
Total Bilirubin: 0.2 mg/dL (ref 0.2–1.2)
Total Protein: 7.4 g/dL (ref 6.1–8.1)

## 2018-11-16 LAB — URINALYSIS, ROUTINE W REFLEX MICROSCOPIC
Bacteria, UA: NONE SEEN /HPF
Bilirubin Urine: NEGATIVE
Glucose, UA: NEGATIVE
Hgb urine dipstick: NEGATIVE
Hyaline Cast: NONE SEEN /LPF
Leukocytes,Ua: NEGATIVE
Nitrite: NEGATIVE
Specific Gravity, Urine: 1.026 (ref 1.001–1.03)
pH: 6.5 (ref 5.0–8.0)

## 2018-11-16 LAB — LIPID PANEL
Cholesterol: 222 mg/dL — ABNORMAL HIGH (ref <200)
HDL: 51 mg/dL (ref >=50)
LDL Cholesterol (Calc): 127 mg/dL (calc) — ABNORMAL HIGH
Non-HDL Cholesterol (Calc): 171 mg/dL (calc) — ABNORMAL HIGH (ref <130)
Total CHOL/HDL Ratio: 4.4 (calc) (ref <5.0)
Triglycerides: 283 mg/dL — ABNORMAL HIGH (ref <150)

## 2018-11-16 LAB — VITAMIN D 25 HYDROXY (VIT D DEFICIENCY, FRACTURES): Vit D, 25-Hydroxy: 19 ng/mL — ABNORMAL LOW (ref 30–100)

## 2018-11-16 LAB — MICROALBUMIN / CREATININE URINE RATIO
Creatinine, Urine: 340 mg/dL — ABNORMAL HIGH (ref 20–275)
Microalb Creat Ratio: 50 mcg/mg creat — ABNORMAL HIGH (ref <30)
Microalb, Ur: 17.1 mg/dL

## 2018-11-16 LAB — IRON, TOTAL/TOTAL IRON BINDING CAP
%SAT: 28 % (calc) (ref 16–45)
Iron: 111 ug/dL (ref 40–190)
TIBC: 399 mcg/dL (calc) (ref 250–450)

## 2018-11-16 LAB — VITAMIN B12: Vitamin B-12: 335 pg/mL (ref 200–1100)

## 2018-11-16 LAB — MAGNESIUM: Magnesium: 1.9 mg/dL (ref 1.5–2.5)

## 2018-11-16 LAB — TSH: TSH: 1.02 mIU/L

## 2018-12-26 NOTE — Progress Notes (Signed)
UNABLE TO REACH PATIENT NO SHOW  HPI 23 y.o.female presents for follow up for medication addition.  Last visit she continued to struggle with anxiety/depression, low dose trintellix was added to her effexor.   She has had klonopin, xanax, valium. Has tried lexapro, zoloft.  She is learning to deal with her anxiety. She is still working at [employer] going into work, she is painting and keeping busy otherwise.   She has been having nausea, vomiting and AB pain. Had negative AB [place] other than fatty liver, had negative HIDA scan and has seen GI. She is doing okay with it, trying to follow FODMAP   Past Medical History:  Diagnosis Date  . Anxiety   . Major depressive disorder 2014  . Pseudotumor cerebri 2010     Allergies  Allergen Reactions  . Sertraline     Other reaction(s): Laryngeal Edema (ALLERGY)  . Bactrim [Sulfamethoxazole-Trimethoprim] Other (See Comments)    Nausea, yeast    Current Outpatient Medications on File Prior to Visit  Medication Sig  . aspirin EC 81 MG tablet Take 81 mg by mouth daily.  . busPIRone (BUSPAR) 5 MG tablet Take 1 tablet (5 mg total) by mouth 2 (two) times daily as needed (anxiety).  . Cholecalciferol (VITAMIN D PO) Take by mouth.  . venlafaxine XR (EFFEXOR-XR) 75 MG 24 hr capsule Take 3 capsules (225 mg total) by mouth daily with breakfast.  . vitamin B-12 (CYANOCOBALAMIN) 500 MCG tablet Take 500 mcg by mouth daily.  Marland Kitchen vortioxetine HBr (TRINTELLIX) 10 MG TABS tablet Take 1 tablet (10 mg total) by mouth daily.  Burr Medico 150-35 MCG/24HR transdermal patch APPLY ONE PATCH TOPICALLY ONCE A WEEK   No current facility-administered medications on file prior to visit.     ROS: all negative except above.   Physical Exam: There were no vitals filed for this visit. There were no vitals taken for this visit.      Quentin Mulling, PA-C 8:31 AM [facility]

## 2018-12-27 ENCOUNTER — Ambulatory Visit: Payer: 59 | Admitting: Physician Assistant

## 2018-12-27 ENCOUNTER — Other Ambulatory Visit: Payer: Self-pay

## 2018-12-27 DIAGNOSIS — F410 Panic disorder [episodic paroxysmal anxiety] without agoraphobia: Secondary | ICD-10-CM

## 2018-12-27 DIAGNOSIS — G43009 Migraine without aura, not intractable, without status migrainosus: Secondary | ICD-10-CM

## 2018-12-27 DIAGNOSIS — E782 Mixed hyperlipidemia: Secondary | ICD-10-CM

## 2019-02-10 ENCOUNTER — Other Ambulatory Visit: Payer: Self-pay | Admitting: Physician Assistant

## 2019-05-06 ENCOUNTER — Other Ambulatory Visit: Payer: Self-pay | Admitting: Adult Health

## 2019-05-06 ENCOUNTER — Other Ambulatory Visit: Payer: Self-pay | Admitting: Physician Assistant

## 2019-05-24 ENCOUNTER — Encounter: Payer: Self-pay | Admitting: Physician Assistant

## 2019-05-29 ENCOUNTER — Ambulatory Visit: Payer: 59 | Admitting: Physician Assistant

## 2019-07-05 ENCOUNTER — Other Ambulatory Visit: Payer: Self-pay | Admitting: Physician Assistant

## 2019-07-05 DIAGNOSIS — F410 Panic disorder [episodic paroxysmal anxiety] without agoraphobia: Secondary | ICD-10-CM

## 2019-08-04 ENCOUNTER — Other Ambulatory Visit: Payer: Self-pay | Admitting: Physician Assistant

## 2019-10-24 ENCOUNTER — Encounter: Payer: Self-pay | Admitting: Physician Assistant

## 2019-10-31 MED ORDER — VENLAFAXINE HCL ER 75 MG PO CP24: ORAL_CAPSULE | ORAL | 0 refills | Status: DC

## 2019-10-31 MED ORDER — XULANE 150-35 MCG/24HR TD PTWK: MEDICATED_PATCH | TRANSDERMAL | 0 refills | Status: DC

## 2019-11-15 NOTE — Progress Notes (Deleted)
Assessment and Plan:   Encounter for general adult medical examination with abnormal findings 1 year Due PAP next year  Episodic paroxysmal anxiety disorder/depression -     vortioxetine HBr (TRINTELLIX) 10 MG TABS tablet; Take 1 tablet (10 mg total) by mouth daily. - add low dose to the effexor - discussed serotonin syndrome possibility- will monitor for symptoms - phone call follow up 1 month  Migraine without aura and responsive to treatment On effexor  Gastroesophageal reflux disease without esophagitis Continue PPI/H2 blocker, diet discussed  Mixed hyperlipidemia -     Lipid panel check lipids decrease fatty foods increase activity.   Elevated BP without diagnosis of hypertension - continue medications, DASH diet, exercise and monitor at home. Call if greater than 130/80.  -     CBC with Differential/Platelet -     COMPLETE METABOLIC PANEL WITH GFR -     TSH -     Urinalysis, Routine w reflex microscopic -     Microalbumin / creatinine urine ratio  Obesity (BMI 30.0-34.9) - follow up 3 months for progress monitoring - increase veggies, decrease carbs - long discussion about weight loss, diet, and exercise  Vitamin D deficiency -     VITAMIN D 25 Hydroxy (Vit-D Deficiency, Fractures)  Medication management -     Magnesium  Screening, anemia, deficiency, iron -     Vitamin B12 -     Iron,Total/Total Iron Binding Cap   Continue diet and meds as discussed. Further disposition pending results of labs.  HPI 24 y.o. female  presents for a physical.    Her blood pressure has been controlled at home, today their BP is     She started on effexor July 2014, she has buspar if she needs it but has not been taking it lately. She has had klonopin, xanax, valium. Has tried lexapro, zoloft.  She is learning to deal with her anxiety. She is still working at [employer] going into work, she is painting and keeping busy otherwise.   She has been having nausea, vomiting  and AB pain. Had negative AB [place] other than fatty liver, had negative HIDA scan and has seen GI. She is doing okay with it, trying to follow FODMAP   She does workout, it stretching in the morning she is trying to get out and walk. She denies chest pain, shortness of breath, dizziness. At [employer], and will transfer to [facility].   BMI is There is no height or weight on file to calculate BMI., she is working on diet and exercise. On the patch, likes it, menses are regular.  Has family history of PCOS.  Wt Readings from Last 3 Encounters:  11/15/18 240 lb (108.9 kg)  09/21/18 243 lb (110.2 kg)  09/06/18 243 lb (110.2 kg)    She is not on cholesterol medication and denies myalgias. Her cholesterol is not at goal. The cholesterol last visit was:   Lab Results  Component Value Date   CHOL 222 (H) 11/15/2018   HDL 51 11/15/2018   LDLCALC 127 (H) 11/15/2018   TRIG 283 (H) 11/15/2018   CHOLHDL 4.4 11/15/2018    . Last A1C in the office was:  Lab Results  Component Value Date   HGBA1C 5.4 06/07/2018   Patient is on Vitamin D supplement.   Lab Results  Component Value Date   VD25OH 19 (L) 11/15/2018        Current Medications:  Current Outpatient Medications on File Prior to  Visit  Medication Sig  . aspirin EC 81 MG tablet Take 81 mg by mouth daily.  . busPIRone (BUSPAR) 5 MG tablet Take 1 tablet by mouth twice daily as needed for Chronic Anxiety.  . Cholecalciferol (VITAMIN D PO) Take by mouth.  . venlafaxine XR (EFFEXOR-XR) 75 MG 24 hr capsule Take 3 capsules by mouth daily with breakfast.  . vitamin B-12 (CYANOCOBALAMIN) 500 MCG tablet Take 500 mcg by mouth daily.  Marland Kitchen vortioxetine HBr (TRINTELLIX) 10 MG TABS tablet Take 1 tablet (10 mg total) by mouth daily.  Patricia Davis 150-35 MCG/24HR transdermal patch APPLY ONE PATCH TOPICALLY ONCE A WEEK   No current facility-administered medications on file prior to visit.   Medical History:  Past Medical History:  Diagnosis Date   . Anxiety   . Major depressive disorder 2014  . Pseudotumor cerebri 2010   TDAP 2010 HPV vaccines- will start Up to date on rest- see records from peds PAP age 32  Review of Systems:  Review of Systems  Constitutional: Negative.   HENT: Negative.   Eyes: Negative.   Respiratory: Negative.   Cardiovascular: Negative for chest pain, palpitations, orthopnea, claudication, leg swelling and PND.  Gastrointestinal: Negative.   Genitourinary: Negative.  Negative for frequency.  Musculoskeletal: Negative.   Skin: Negative.   Neurological: Negative.  Negative for dizziness.  Psychiatric/Behavioral: Negative for depression, hallucinations, memory loss, substance abuse and suicidal ideas. The patient is not nervous/anxious (improved with effexor) and does not have insomnia.    Allergies Allergies  Allergen Reactions  . Sertraline     Other reaction(s): Laryngeal Edema (ALLERGY)  . Bactrim [Sulfamethoxazole-Trimethoprim] Other (See Comments)    Nausea, yeast    SURGICAL HISTORY She  has a past surgical history that includes Wisdom tooth extraction (05/2014). FAMILY HISTORY Her family history includes Colon cancer in her maternal grandfather; Depression in her father and mother; Diverticulitis in her maternal aunt and maternal great-grandmother; Gallbladder disease in her father and mother; Hypothyroidism in her mother; Pancreatic cancer in her maternal grandfather. SOCIAL HISTORY She  reports that she has never smoked. She has never used smokeless tobacco. She reports current drug use. Drug: Marijuana. She reports that she does not drink alcohol.  Physical Exam: There were no vitals taken for this visit. Wt Readings from Last 3 Encounters:  11/15/18 240 lb (108.9 kg)  09/21/18 243 lb (110.2 kg)  09/06/18 243 lb (110.2 kg)   General Appearance: Well nourished, in no apparent distress. Eyes: PERRLA, EOMs, conjunctiva no swelling or erythema Sinuses: No Frontal/maxillary  tenderness ENT/Mouth: Ext aud canals clear, TMs without erythema, bulging. No erythema, swelling, or exudate on post pharynx.  Tonsils not swollen or erythematous. Hearing normal.  Neck: Supple, thyroid normal.  Respiratory: Respiratory effort normal, BS equal bilaterally without rales, rhonchi, wheezing or stridor.  Cardio: RRR with no MRGs. Brisk peripheral pulses without edema.  Abdomen: Soft, + BS,  Non tender, no guarding, rebound, hernias, masses. Lymphatics: Non tender without lymphadenopathy.  Musculoskeletal: Full ROM, 5/5 strength, Normal gait Skin: Warm, dry without rashes, lesions, ecchymosis.  Neuro: Cranial nerves intact. Normal muscle tone, no cerebellar symptoms. Psych: Awake and oriented X 3, normal affect, Insight and Judgment appropriate.    Vicie Mutters, PA-C 1:10 PM [facility]

## 2019-11-19 ENCOUNTER — Encounter: Payer: 59 | Admitting: Physician Assistant

## 2019-11-19 ENCOUNTER — Ambulatory Visit: Payer: 59 | Attending: Internal Medicine

## 2019-11-19 DIAGNOSIS — Z23 Encounter for immunization: Secondary | ICD-10-CM

## 2019-11-19 NOTE — Progress Notes (Signed)
   Covid-19 Vaccination Clinic  Name:  Patricia Davis    MRN: [id number] DOB: 09/18/1995  11/19/2019  [name] was observed post Covid-19 immunization for 15 minutes without incident. She was provided with Vaccine Information Sheet and instruction to access the V-Safe system.   [name] was instructed to call 911 with any severe reactions post vaccine: Marland Kitchen Difficulty breathing  . Swelling of face and throat  . A fast heartbeat  . A bad rash all over body  . Dizziness and weakness   Immunizations Administered    Name Date Dose VIS Date Route   Pfizer COVID-19 Vaccine 11/19/2019  1:23 PM 0.3 mL 07/20/2019 Intramuscular   Manufacturer: [employer], [employer]   Lot: [id number]   NDC: [address]-6

## 2019-11-19 NOTE — Progress Notes (Deleted)
Assessment and Plan:   Encounter for general adult medical examination with abnormal findings 1 year Due PAP next year  Episodic paroxysmal anxiety disorder/depression -     vortioxetine HBr (TRINTELLIX) 10 MG TABS tablet; Take 1 tablet (10 mg total) by mouth daily. - add low dose to the effexor - discussed serotonin syndrome possibility- will monitor for symptoms - phone call follow up 1 month  Migraine without aura and responsive to treatment On effexor  Gastroesophageal reflux disease without esophagitis Continue PPI/H2 blocker, diet discussed  Mixed hyperlipidemia -     Lipid panel check lipids decrease fatty foods increase activity.   Elevated BP without diagnosis of hypertension - continue medications, DASH diet, exercise and monitor at home. Call if greater than 130/80.  -     CBC with Differential/Platelet -     COMPLETE METABOLIC PANEL WITH GFR -     TSH -     Urinalysis, Routine w reflex microscopic -     Microalbumin / creatinine urine ratio  Obesity (BMI 30.0-34.9) - follow up 3 months for progress monitoring - increase veggies, decrease carbs - long discussion about weight loss, diet, and exercise  Vitamin D deficiency -     VITAMIN D 25 Hydroxy (Vit-D Deficiency, Fractures)  Medication management -     Magnesium  Screening, anemia, deficiency, iron -     Vitamin B12 -     Iron,Total/Total Iron Binding Cap   Continue diet and meds as discussed. Further disposition pending results of labs.  HPI 24 y.o. female  presents for a physical.    Her blood pressure has been controlled at home, today their BP is     She started on effexor July 2014, she has buspar if she needs it but has not been taking it lately. She has had klonopin, xanax, valium. Has tried lexapro, zoloft.  She is learning to deal with her anxiety. She is still working at [employer] going into work, she is painting and keeping busy otherwise.   She has been having nausea, vomiting  and AB pain. Had negative AB [place] other than fatty liver, had negative HIDA scan and has seen GI. She is doing okay with it, trying to follow FODMAP   She does workout, it stretching in the morning she is trying to get out and walk. She denies chest pain, shortness of breath, dizziness. At [employer], and will transfer to [facility].   BMI is There is no height or weight on file to calculate BMI., she is working on diet and exercise. On the patch, likes it, menses are regular.  Has family history of PCOS.  Wt Readings from Last 3 Encounters:  11/15/18 240 lb (108.9 kg)  09/21/18 243 lb (110.2 kg)  09/06/18 243 lb (110.2 kg)    She is not on cholesterol medication and denies myalgias. Her cholesterol is not at goal. The cholesterol last visit was:   Lab Results  Component Value Date   CHOL 222 (H) 11/15/2018   HDL 51 11/15/2018   LDLCALC 127 (H) 11/15/2018   TRIG 283 (H) 11/15/2018   CHOLHDL 4.4 11/15/2018    . Last A1C in the office was:  Lab Results  Component Value Date   HGBA1C 5.4 06/07/2018   Patient is on Vitamin D supplement.   Lab Results  Component Value Date   VD25OH 19 (L) 11/15/2018        Current Medications:  Current Outpatient Medications on File Prior to  Visit  Medication Sig  . aspirin EC 81 MG tablet Take 81 mg by mouth daily.  . busPIRone (BUSPAR) 5 MG tablet Take 1 tablet by mouth twice daily as needed for Chronic Anxiety.  . Cholecalciferol (VITAMIN D PO) Take by mouth.  . venlafaxine XR (EFFEXOR-XR) 75 MG 24 hr capsule Take 3 capsules by mouth daily with breakfast.  . vitamin B-12 (CYANOCOBALAMIN) 500 MCG tablet Take 500 mcg by mouth daily.  Marland Kitchen vortioxetine HBr (TRINTELLIX) 10 MG TABS tablet Take 1 tablet (10 mg total) by mouth daily.  Marilu Favre 150-35 MCG/24HR transdermal patch APPLY ONE PATCH TOPICALLY ONCE A WEEK   No current facility-administered medications on file prior to visit.   Medical History:  Past Medical History:  Diagnosis Date   . Anxiety   . Major depressive disorder 2014  . Pseudotumor cerebri 2010   TDAP 2010 HPV vaccines- will start Up to date on rest- see records from peds PAP age 65  Review of Systems:  Review of Systems  Constitutional: Negative.   HENT: Negative.   Eyes: Negative.   Respiratory: Negative.   Cardiovascular: Negative for chest pain, palpitations, orthopnea, claudication, leg swelling and PND.  Gastrointestinal: Negative.   Genitourinary: Negative.  Negative for frequency.  Musculoskeletal: Negative.   Skin: Negative.   Neurological: Negative.  Negative for dizziness.  Psychiatric/Behavioral: Negative for depression, hallucinations, memory loss, substance abuse and suicidal ideas. The patient is not nervous/anxious (improved with effexor) and does not have insomnia.    Allergies Allergies  Allergen Reactions  . Sertraline     Other reaction(s): Laryngeal Edema (ALLERGY)  . Bactrim [Sulfamethoxazole-Trimethoprim] Other (See Comments)    Nausea, yeast    SURGICAL HISTORY She  has a past surgical history that includes Wisdom tooth extraction (05/2014). FAMILY HISTORY Her family history includes Colon cancer in her maternal grandfather; Depression in her father and mother; Diverticulitis in her maternal aunt and maternal great-grandmother; Gallbladder disease in her father and mother; Hypothyroidism in her mother; Pancreatic cancer in her maternal grandfather. SOCIAL HISTORY She  reports that she has never smoked. She has never used smokeless tobacco. She reports current drug use. Drug: Marijuana. She reports that she does not drink alcohol.  Physical Exam: There were no vitals taken for this visit. Wt Readings from Last 3 Encounters:  11/15/18 240 lb (108.9 kg)  09/21/18 243 lb (110.2 kg)  09/06/18 243 lb (110.2 kg)   General Appearance: Well nourished, in no apparent distress. Eyes: PERRLA, EOMs, conjunctiva no swelling or erythema Sinuses: No Frontal/maxillary  tenderness ENT/Mouth: Ext aud canals clear, TMs without erythema, bulging. No erythema, swelling, or exudate on post pharynx.  Tonsils not swollen or erythematous. Hearing normal.  Neck: Supple, thyroid normal.  Respiratory: Respiratory effort normal, BS equal bilaterally without rales, rhonchi, wheezing or stridor.  Cardio: RRR with no MRGs. Brisk peripheral pulses without edema.  Abdomen: Soft, + BS,  Non tender, no guarding, rebound, hernias, masses. Lymphatics: Non tender without lymphadenopathy.  Musculoskeletal: Full ROM, 5/5 strength, Normal gait Skin: Warm, dry without rashes, lesions, ecchymosis.  Neuro: Cranial nerves intact. Normal muscle tone, no cerebellar symptoms. Psych: Awake and oriented X 3, normal affect, Insight and Judgment appropriate.    Vicie Mutters, PA-C 2:08 PM [facility]

## 2019-11-21 ENCOUNTER — Encounter: Payer: 59 | Admitting: Physician Assistant

## 2019-12-10 ENCOUNTER — Ambulatory Visit: Payer: 59 | Attending: Internal Medicine

## 2019-12-10 DIAGNOSIS — Z23 Encounter for immunization: Secondary | ICD-10-CM

## 2019-12-10 NOTE — Progress Notes (Signed)
   Covid-19 Vaccination Clinic  Name:  Laveah Gloster    MRN: [id number] DOB: 20-Jul-1996  12/10/2019  Ms. Tourangeau was observed post Covid-19 immunization for 15 minutes without incident. She was provided with Vaccine Information Sheet and instruction to access the V-Safe system.   Ms. Oshel was instructed to call 911 with any severe reactions post vaccine: Marland Kitchen Difficulty breathing  . Swelling of face and throat  . A fast heartbeat  . A bad rash all over body  . Dizziness and weakness   Immunizations Administered    Name Date Dose VIS Date Route   Pfizer COVID-19 Vaccine 12/10/2019  2:42 PM 0.3 mL 10/03/2018 Intramuscular   Manufacturer: [employer], [employer]   Lot: [id number]   NDC: [address]-9

## 2019-12-19 ENCOUNTER — Encounter: Payer: 59 | Admitting: Physician Assistant

## 2020-01-30 ENCOUNTER — Encounter: Payer: 59 | Admitting: Physician Assistant

## 2020-02-13 ENCOUNTER — Other Ambulatory Visit: Payer: Self-pay

## 2020-02-13 ENCOUNTER — Encounter: Payer: Self-pay | Admitting: Physician Assistant

## 2020-02-13 ENCOUNTER — Ambulatory Visit: Payer: 59 | Admitting: Physician Assistant

## 2020-02-13 VITALS — BP 120/76 | HR 112 | Temp 97.5°F | Ht 69.0 in | Wt 200.8 lb

## 2020-02-13 DIAGNOSIS — E782 Mixed hyperlipidemia: Secondary | ICD-10-CM

## 2020-02-13 DIAGNOSIS — Z131 Encounter for screening for diabetes mellitus: Secondary | ICD-10-CM

## 2020-02-13 DIAGNOSIS — E559 Vitamin D deficiency, unspecified: Secondary | ICD-10-CM

## 2020-02-13 DIAGNOSIS — E669 Obesity, unspecified: Secondary | ICD-10-CM

## 2020-02-13 DIAGNOSIS — K219 Gastro-esophageal reflux disease without esophagitis: Secondary | ICD-10-CM

## 2020-02-13 DIAGNOSIS — Z23 Encounter for immunization: Secondary | ICD-10-CM

## 2020-02-13 DIAGNOSIS — Z79899 Other long term (current) drug therapy: Secondary | ICD-10-CM

## 2020-02-13 DIAGNOSIS — Z113 Encounter for screening for infections with a predominantly sexual mode of transmission: Secondary | ICD-10-CM

## 2020-02-13 DIAGNOSIS — F410 Panic disorder [episodic paroxysmal anxiety] without agoraphobia: Secondary | ICD-10-CM

## 2020-02-13 DIAGNOSIS — R030 Elevated blood-pressure reading, without diagnosis of hypertension: Secondary | ICD-10-CM

## 2020-02-13 DIAGNOSIS — G43009 Migraine without aura, not intractable, without status migrainosus: Secondary | ICD-10-CM

## 2020-02-13 DIAGNOSIS — Z0000 Encounter for general adult medical examination without abnormal findings: Secondary | ICD-10-CM | POA: Diagnosis not present

## 2020-02-13 DIAGNOSIS — Z0001 Encounter for general adult medical examination with abnormal findings: Secondary | ICD-10-CM

## 2020-02-13 MED ORDER — PROPRANOLOL HCL 10 MG PO TABS: 10.0000 mg | ORAL_TABLET | Freq: Two times a day (BID) | ORAL | 11 refills | Status: DC

## 2020-02-13 NOTE — Patient Instructions (Addendum)
Inderal (generic name: propranolol) 10 mg strength: take 1 pill AS needed for anxiety/panic attack or can take it about 10-20 mins before public event/seeing people  Common side effect reported are: lethargy, sedation, low blood pressure and low pulse rate.   Please monitor your BP and Pulse every few days, if your pulse drops lower than 55 you may feel bad and we may have to stop.  Same with your BP below 110/55 or if you get dizziness.    General eating tips  What to Avoid . Avoid added sugars o Often added sugar can be found in processed foods such as many condiments, dry cereals, cakes, cookies, chips, crisps, crackers, candies, sweetened drinks, etc.  o Read labels and AVOID/DECREASE use of foods with the following in their ingredient list: Sugar, fructose, high fructose corn syrup, sucrose, glucose, maltose, dextrose, molasses, cane sugar, brown sugar, any type of syrup, agave nectar, etc.   . Avoid snacking in between meals- drink water or if you feel you need a snack, pick a high water content snack such as cucumbers, watermelon, or any veggie.  Marland Kitchen Avoid foods made with flour o If you are going to eat food made with flour, choose those made with whole-grains; and, minimize your consumption as much as is tolerable . Avoid processed foods o These foods are generally stocked in the middle of the grocery store.  o Focus on shopping on the perimeter of the grocery.  What to Include . Vegetables o GREEN LEAFY VEGETABLES: Kale, spinach, mustard greens, collard greens, cabbage, broccoli, etc. o OTHER: Asparagus, cauliflower, eggplant, carrots, peas, Brussel sprouts, tomatoes, bell peppers, zucchini, beets, cucumbers, etc. . Grains, seeds, and legumes o Beans: kidney beans, black eyed peas, garbanzo beans, black beans, pinto beans, etc. o Whole, unrefined grains: brown rice, barley, bulgur, oatmeal, etc. . Healthy fats  o Avoid highly processed fats such as vegetable oil o Examples of  healthy fats: avocado, olives, virgin olive oil, dark chocolate (?72% Cocoa), nuts (peanuts, almonds, walnuts, cashews, pecans, etc.) o Please still do small amount of these healthy fats, they are dense in calories.  . Low - Moderate Intake of Animal Sources of Protein o Meat sources: chicken, [place], salmon, tuna. Limit to 4 ounces of meat at one time or the size of your palm. o Consider limiting dairy sources, but when choosing dairy focus on: PLAIN [place] yogurt, cottage cheese, high-protein milk . Fruit o Choose berries
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
## 2020-02-14 ENCOUNTER — Encounter: Payer: Self-pay | Admitting: Physician Assistant

## 2020-02-14 LAB — COMPLETE METABOLIC PANEL WITH GFR
AG Ratio: 1.8 (calc) (ref 1.0–2.5)
ALT: 28 U/L (ref 6–29)
AST: 22 U/L (ref 10–30)
Albumin: 4.6 g/dL (ref 3.6–5.1)
Alkaline phosphatase (APISO): 80 U/L (ref 31–125)
BUN: 7 mg/dL (ref 7–25)
CO2: 26 mmol/L (ref 20–32)
Calcium: 10.3 mg/dL — ABNORMAL HIGH (ref 8.6–10.2)
Chloride: 104 mmol/L (ref 98–110)
Creat: 0.54 mg/dL (ref 0.50–1.10)
GFR, Est African American: 156 mL/min/{1.73_m2} (ref >=60)
GFR, Est Non African American: 135 mL/min/{1.73_m2} (ref >=60)
Globulin: 2.5 g/dL (calc) (ref 1.9–3.7)
Glucose, Bld: 102 mg/dL — ABNORMAL HIGH (ref 65–99)
Potassium: 4.3 mmol/L (ref 3.5–5.3)
Sodium: 138 mmol/L (ref 135–146)
Total Bilirubin: 0.3 mg/dL (ref 0.2–1.2)
Total Protein: 7.1 g/dL (ref 6.1–8.1)

## 2020-02-14 LAB — CBC WITH DIFFERENTIAL/PLATELET
Absolute Monocytes: 832 cells/uL (ref 200–950)
Basophils Absolute: 62 cells/uL (ref 0–200)
Basophils Relative: 0.4 %
Eosinophils Absolute: 123 cells/uL (ref 15–500)
Eosinophils Relative: 0.8 %
HCT: 43.9 % (ref 35.0–45.0)
Hemoglobin: 15 g/dL (ref 11.7–15.5)
Lymphs Abs: 4127 cells/uL — ABNORMAL HIGH (ref 850–3900)
MCH: 30.3 pg (ref 27.0–33.0)
MCHC: 34.2 g/dL (ref 32.0–36.0)
MCV: 88.7 fL (ref 80.0–100.0)
MPV: 11.1 fL (ref 7.5–12.5)
Monocytes Relative: 5.4 %
Neutro Abs: 10256 cells/uL — ABNORMAL HIGH (ref 1500–7800)
Neutrophils Relative %: 66.6 %
Platelets: 322 10*3/uL (ref 140–400)
RBC: 4.95 10*6/uL (ref 3.80–5.10)
RDW: 13.1 % (ref 11.0–15.0)
Total Lymphocyte: 26.8 %
WBC: 15.4 10*3/uL — ABNORMAL HIGH (ref 3.8–10.8)

## 2020-02-14 LAB — HEMOGLOBIN A1C
Hgb A1c MFr Bld: 5 % of total Hgb (ref <5.7)
Mean Plasma Glucose: 97 (calc)
eAG (mmol/L): 5.4 (calc)

## 2020-02-14 LAB — URINALYSIS, ROUTINE W REFLEX MICROSCOPIC
Bilirubin Urine: NEGATIVE
Glucose, UA: NEGATIVE
Hgb urine dipstick: NEGATIVE
Hyaline Cast: NONE SEEN /LPF
Leukocytes,Ua: NEGATIVE
Nitrite: NEGATIVE
RBC / HPF: NONE SEEN /HPF (ref 0–2)
Specific Gravity, Urine: 1.023 (ref 1.001–1.03)
pH: 7 (ref 5.0–8.0)

## 2020-02-14 LAB — LIPID PANEL
Cholesterol: 249 mg/dL — ABNORMAL HIGH (ref <200)
HDL: 66 mg/dL (ref >=50)
LDL Cholesterol (Calc): 151 mg/dL (calc) — ABNORMAL HIGH
Non-HDL Cholesterol (Calc): 183 mg/dL (calc) — ABNORMAL HIGH (ref <130)
Total CHOL/HDL Ratio: 3.8 (calc) (ref <5.0)
Triglycerides: 181 mg/dL — ABNORMAL HIGH (ref <150)

## 2020-02-14 LAB — MICROALBUMIN / CREATININE URINE RATIO
Creatinine, Urine: 247 mg/dL (ref 20–275)
Microalb Creat Ratio: 30 mcg/mg creat — ABNORMAL HIGH (ref <30)
Microalb, Ur: 7.5 mg/dL

## 2020-02-14 LAB — TSH: TSH: 1.66 mIU/L

## 2020-02-14 LAB — RPR: RPR Ser Ql: NONREACTIVE

## 2020-02-14 LAB — MAGNESIUM: Magnesium: 1.9 mg/dL (ref 1.5–2.5)

## 2020-02-14 LAB — VITAMIN D 25 HYDROXY (VIT D DEFICIENCY, FRACTURES): Vit D, 25-Hydroxy: 23 ng/mL — ABNORMAL LOW (ref 30–100)

## 2020-02-14 LAB — HSV(HERPES SIMPLEX VRS) I + II AB-IGG
HAV 1 IGG,TYPE SPECIFIC AB: <0.9 index
HSV 2 IGG,TYPE SPECIFIC AB: <0.9 index

## 2020-02-14 LAB — HIV ANTIBODY (ROUTINE TESTING W REFLEX): HIV 1&2 Ab, 4th Generation: NONREACTIVE

## 2020-03-01 ENCOUNTER — Other Ambulatory Visit: Payer: Self-pay | Admitting: Physician Assistant

## 2020-04-02 MED ORDER — VENLAFAXINE HCL ER 75 MG PO CP24: ORAL_CAPSULE | ORAL | 1 refills | Status: DC

## 2020-04-08 NOTE — Progress Notes (Deleted)
   Subjective:    Patient ID: Patricia Davis, female    DOB: 11/25/1995, 24 y.o.   MRN: [id number]  HPI 24 y.o. obese WF presents for anxiety.    She has anxiety/agoraphobia, she states she has had panic attacks. She will have intrusive thoughts that she is in danger but she has had more physical symptoms with sweating, nausea, palpitations and muscles will tense. Can happen 5 x  In one day, state waves of panic. No diarrhea, vomiting.  She has been on effexor and recently started on propranolol.    Dr. Valarie Cones the psychatrist. Was on zoloft before. She has valium for as needed but states it is too strong to function/drive with.   BMI is There is no height or weight on file to calculate BMI., she is working on diet and exercise. Wt Readings from Last 3 Encounters:  02/13/20 200 lb 12.8 oz (91.1 kg)  11/15/18 240 lb (108.9 kg)  09/21/18 243 lb (110.2 kg)   There were no vitals taken for this visit.    Medications  Current Outpatient Medications (Endocrine & Metabolic):  Marland Kitchen  XULANE 150-35 MCG/24HR transdermal patch, Apply one patch topically once a week.  Current Outpatient Medications (Cardiovascular):  .  propranolol (INDERAL) 10 MG tablet, Take 1 tablet (10 mg total) by mouth 2 (two) times daily.   Current Outpatient Medications (Analgesics):  .  aspirin EC 81 MG tablet, Take 81 mg by mouth daily.  Current Outpatient Medications (Hematological):  .  vitamin B-12 (CYANOCOBALAMIN) 500 MCG tablet, Take 500 mcg by mouth daily.  Current Outpatient Medications (Other):  Marland Kitchen  Cholecalciferol (VITAMIN D PO), Take by mouth. .  Omega-3 Fatty Acids (FISH OIL PO), Take by mouth daily. Marland Kitchen  venlafaxine XR (EFFEXOR-XR) 75 MG 24 hr capsule, Take 3 capsules by mouth daily with breakfast.  Problem list She has Migraine without aura and responsive to treatment; Episodic paroxysmal anxiety disorder; GERD (gastroesophageal reflux disease); Vitamin D deficiency; Obesity (BMI 30.0-34.9);  Hyperlipidemia; and Elevated BP without diagnosis of hypertension on their problem list.  Review of Systems See HPI    Objective:   Physical Exam  General Appearance: Well nourished, in no apparent distress. Eyes: PERRLA, EOMs, conjunctiva no swelling or erythema Sinuses: No Frontal/maxillary tenderness ENT/Mouth: Ext aud canals clear, TMs without erythema, bulging. No erythema, swelling, or exudate on post pharynx.  Tonsils not swollen or erythematous. Hearing normal.  Neck: Supple, thyroid normal.  Respiratory: Respiratory effort normal, BS equal bilaterally without rales, rhonchi, wheezing or stridor.  Cardio: RRR with no MRGs. Brisk peripheral pulses without edema.  Abdomen: Soft, + BS,  + RUQ tenderness with + Murphys, no guarding, rebound, hernias, masses. Lymphatics: Non tender without lymphadenopathy.  Musculoskeletal: Full ROM, 5/5 strength, Normal gait Skin: Warm, dry without rashes, lesions, ecchymosis.  Neuro: Cranial nerves intact. Normal muscle tone, no cerebellar symptoms. Psych: Awake and oriented X 3, normal affect, Insight and Judgment appropriate.       Assessment & Plan:  Patricia Davis was seen today for follow-up.     Future Appointments  Date Time Provider Department Center  04/10/2020 11:00 AM Quentin Mulling, PA-C [id number] None  02/12/2021  3:00 PM Quentin Mulling, PA-C [id number] None

## 2020-04-10 ENCOUNTER — Ambulatory Visit: Payer: 59 | Admitting: Physician Assistant

## 2020-04-23 ENCOUNTER — Ambulatory Visit: Payer: 59 | Admitting: Physician Assistant

## 2020-05-09 NOTE — Progress Notes (Deleted)
  Assessment and Plan:    HPI 24 y.o.female presents for follow up for anxiety and PAP She had CPE last visit, was unable to get PAP due to time constraints.  She was started on propranolol as needed for panic attacks.    She started on effexor July 2014, she states her buspar did not help.  She has had klonopin, xanax, valium. Has tried lexapro, zoloft, trintellix. She has had more panic attacks since she has been going more since being fully vaccinated.  She is learning to deal with her anxiety.fidget toy helps, and breathing techniques help. She is still working at [employer] going into work, she is painting and keeping busy otherwise.   Patient Active Problem List   Diagnosis Date Noted  . Vitamin D deficiency 11/16/2017  . Obesity (BMI 30.0-34.9) 11/16/2017  . Hyperlipidemia 11/16/2017  . Elevated BP without diagnosis of hypertension 11/16/2017  . GERD (gastroesophageal reflux disease) 01/21/2016  . Episodic paroxysmal anxiety disorder 12/27/2012  . Migraine without aura and responsive to treatment 10/18/2011     Current Outpatient Medications (Endocrine & Metabolic):  Marland Kitchen  XULANE 150-35 MCG/24HR transdermal patch, Apply one patch topically once a week.  Current Outpatient Medications (Cardiovascular):  .  propranolol (INDERAL) 10 MG tablet, Take 1 tablet (10 mg total) by mouth 2 (two) times daily.   Current Outpatient Medications (Analgesics):  .  aspirin EC 81 MG tablet, Take 81 mg by mouth daily.  Current Outpatient Medications (Hematological):  .  vitamin B-12 (CYANOCOBALAMIN) 500 MCG tablet, Take 500 mcg by mouth daily.  Current Outpatient Medications (Other):  Marland Kitchen  Cholecalciferol (VITAMIN D PO), Take by mouth. .  Omega-3 Fatty Acids (FISH OIL PO), Take by mouth daily. Marland Kitchen  venlafaxine XR (EFFEXOR-XR) 75 MG 24 hr capsule, Take 3 capsules by mouth daily with breakfast.  Allergies  Allergen Reactions  . Sertraline     Other reaction(s): Laryngeal Edema (ALLERGY)   . Bactrim [Sulfamethoxazole-Trimethoprim] Other (See Comments)    Nausea, yeast    ROS: all negative except above.   Physical Exam: There were no vitals filed for this visit. There were no vitals taken for this visit. General Appearance: Well nourished, in no apparent distress. Eyes: PERRLA, EOMs, conjunctiva no swelling or erythema Sinuses: No Frontal/maxillary tenderness ENT/Mouth: Ext aud canals clear, TMs without erythema, bulging. No erythema, swelling, or exudate on post pharynx.  Tonsils not swollen or erythematous. Hearing normal.  Neck: Supple, thyroid normal.  Respiratory: Respiratory effort normal, BS equal bilaterally without rales, rhonchi, wheezing or stridor.  Cardio: RRR with no MRGs. Brisk peripheral pulses without edema.  Abdomen: Soft, + BS.  Non tender, no guarding, rebound, hernias, masses. Lymphatics: Non tender without lymphadenopathy.  Musculoskeletal: Full ROM, 5/5 strength, normal gait.  Skin: Warm, dry without rashes, lesions, ecchymosis.  Neuro: Cranial nerves intact. Normal muscle tone, no cerebellar symptoms. Sensation intact.  Psych: Awake and oriented X 3, normal affect, Insight and Judgment appropriate.     Quentin Mulling, PA-C 12:24 PM [facility]

## 2020-05-12 ENCOUNTER — Ambulatory Visit: Payer: 59 | Admitting: Physician Assistant

## 2020-05-30 ENCOUNTER — Other Ambulatory Visit: Payer: Self-pay | Admitting: Internal Medicine

## 2020-06-04 ENCOUNTER — Encounter: Payer: Self-pay | Admitting: Adult Health Nurse Practitioner

## 2020-06-04 ENCOUNTER — Other Ambulatory Visit: Payer: Self-pay

## 2020-06-04 ENCOUNTER — Ambulatory Visit: Payer: 59 | Admitting: Adult Health Nurse Practitioner

## 2020-06-04 VITALS — BP 124/76 | HR 104 | Temp 97.9°F | Wt 208.0 lb

## 2020-06-04 DIAGNOSIS — R109 Unspecified abdominal pain: Secondary | ICD-10-CM | POA: Diagnosis not present

## 2020-06-04 DIAGNOSIS — R1030 Lower abdominal pain, unspecified: Secondary | ICD-10-CM | POA: Diagnosis not present

## 2020-06-04 DIAGNOSIS — R112 Nausea with vomiting, unspecified: Secondary | ICD-10-CM | POA: Diagnosis not present

## 2020-06-04 NOTE — Progress Notes (Signed)
Assessment and Plan:  Patricia Davis was seen today for abdominal pain.  Diagnoses and all orders for this visit:  Abdominal cramps -     hCG, serum, qualitative  Lower abdominal pain Pelvic exam, manual unremarkable -     CBC with Differential/Platelet -     COMPLETE METABOLIC PANEL WITH GFR -     hCG, serum, qualitative  Nausea and vomiting, intractability of vomiting not specified, unspecified vomiting type -     Amylase -     Lipase  Discussed monitoring symptoms and hospital precautions.  CHECK LABS, imaging Discussed connection with OB/GYN related to consideration for pregnancy/family planning.  Further disposition pending results of labs. Discussed med's effects and SE's.   Over 30 minutes of face to face interview, exam, counseling, chart review, and critical decision making was performed.   Future Appointments  Date Time Provider Department Center  06/04/2020  1:45 PM Elder Negus, NP [facility] None  02/12/2021  3:00 PM Read Bonelli, Bella Kennedy, NP [facility] None    ------------------------------------------------------------------------------------------------------------------   HPI 24 y.o.female presents for abdominal cramps that started about one week ago with intermittent cramping. On right side is the worse.  Bilateral lower abdominal and does radiate to the back.  Increase in vaginal discharge but only in amount.  Denies any dypenira.  Using condoms for birth control at this time.  She recently came off the Xulane patch about 2.5 months.  She did have some pre-menstral symptoms with cramping.  Typically she starts around 19th.  She has taken two pregnancy test which has been negative.   She reports severe nausea today no emesis.  Gwenith Daily been able to sleep and the pain does not wake here, but reports she is waking up sweaty.       Has seen OB/GYN [place]. Has not had a pap.     Mother, ectopic at age 42 Menopause 81, periods started age 54.    Past Medical History:   Diagnosis Date  . Anxiety   . Major depressive disorder 2014  . Pseudotumor cerebri 2010     Allergies  Allergen Reactions  . Sertraline     Other reaction(s): Laryngeal Edema (ALLERGY)  . Bactrim [Sulfamethoxazole-Trimethoprim] Other (See Comments)    Nausea, yeast    Current Outpatient Medications on File Prior to Visit  Medication Sig  . aspirin EC 81 MG tablet Take 81 mg by mouth daily.  . busPIRone (BUSPAR) 5 MG tablet Take       1 tablet      2 x /day       for  Chronic Anxiety  . Cholecalciferol (VITAMIN D PO) Take by mouth.  . Omega-3 Fatty Acids (FISH OIL PO) Take by mouth daily.  . propranolol (INDERAL) 10 MG tablet Take 1 tablet (10 mg total) by mouth 2 (two) times daily.  Marland Kitchen venlafaxine XR (EFFEXOR-XR) 75 MG 24 hr capsule Take 3 capsules by mouth daily with breakfast.  . vitamin B-12 (CYANOCOBALAMIN) 500 MCG tablet Take 500 mcg by mouth daily.  Burr Medico 150-35 MCG/24HR transdermal patch Apply one patch topically once a week.   No current facility-administered medications on file prior to visit.    ROS: all negative except above.   Physical Exam:  There were no vitals taken for this visit.  General Appearance: Well nourished, in no apparent distress. Eyes: PERRLA, EOMs, conjunctiva no swelling or erythema Sinuses: No Frontal/maxillary tenderness ENT/Mouth: Ext aud canals clear, TMs without erythema, bulging. No erythema, swelling, or exudate on post  pharynx.  Tonsils not swollen or erythematous. Hearing normal.  Neck: Supple, thyroid normal.  Respiratory: Respiratory effort normal, BS equal bilaterally without rales, rhonchi, wheezing or stridor.  Cardio: RRR with no MRGs. Brisk peripheral pulses without edema.  Abdomen: Soft, + BS.  Non tender, no guarding, rebound, hernias, masses. Lymphatics: Non tender without lymphadenopathy.  Musculoskeletal: Full ROM, 5/5 strength, normal gait.  Skin: Warm, dry without rashes, lesions, ecchymosis.  Neuro: Cranial nerves  intact. Normal muscle tone, no cerebellar symptoms. Sensation intact.  Psych: Awake and oriented X 3, normal affect, Insight and Judgment appropriate.    Pelvic: manual, no external abnormalities, no skene or bartholinian glad enlargement/tenderness, no CMT, no rashes lesions noted.  Elder Negus, Edrick Oh, DNP [facility] 06/04/2020  11:36 AM

## 2020-06-04 NOTE — Patient Instructions (Signed)
  We are checking labs today to look for infection via blood count.  Will check kidney liver function and some pancrease enzymes related to nausea and vomiting.  We will check a blood hcg, for pregnancy since urine hcg was not present.  Severe abdominal pains that do not ease with ibuprofen or heat and accompanied by nausea vomiting please seek immediate attention via 911 or ER visit.  We can consider imaging, transvaginal to view overaries or abdominal ultrasound should this persist.  Contact [facility] to set up women's health care going forward for long term management.

## 2020-06-05 ENCOUNTER — Other Ambulatory Visit: Payer: Self-pay | Admitting: Adult Health Nurse Practitioner

## 2020-06-05 ENCOUNTER — Encounter: Payer: Self-pay | Admitting: Adult Health Nurse Practitioner

## 2020-06-05 DIAGNOSIS — R1031 Right lower quadrant pain: Secondary | ICD-10-CM

## 2020-06-05 DIAGNOSIS — R1030 Lower abdominal pain, unspecified: Secondary | ICD-10-CM

## 2020-06-05 LAB — CBC WITH DIFFERENTIAL/PLATELET
Absolute Monocytes: 769 cells/uL (ref 200–950)
Basophils Absolute: 37 cells/uL (ref 0–200)
Basophils Relative: 0.3 %
Eosinophils Absolute: 342 cells/uL (ref 15–500)
Eosinophils Relative: 2.8 %
HCT: 42.7 % (ref 35.0–45.0)
Hemoglobin: 14.9 g/dL (ref 11.7–15.5)
Lymphs Abs: 3684 cells/uL (ref 850–3900)
MCH: 31.5 pg (ref 27.0–33.0)
MCHC: 34.9 g/dL (ref 32.0–36.0)
MCV: 90.3 fL (ref 80.0–100.0)
MPV: 11.3 fL (ref 7.5–12.5)
Monocytes Relative: 6.3 %
Neutro Abs: 7369 cells/uL (ref 1500–7800)
Neutrophils Relative %: 60.4 %
Platelets: 293 10*3/uL (ref 140–400)
RBC: 4.73 10*6/uL (ref 3.80–5.10)
RDW: 13.1 % (ref 11.0–15.0)
Total Lymphocyte: 30.2 %
WBC: 12.2 10*3/uL — ABNORMAL HIGH (ref 3.8–10.8)

## 2020-06-05 LAB — COMPLETE METABOLIC PANEL WITH GFR
AG Ratio: 1.7 (calc) (ref 1.0–2.5)
ALT: 22 U/L (ref 6–29)
AST: 20 U/L (ref 10–30)
Albumin: 4.7 g/dL (ref 3.6–5.1)
Alkaline phosphatase (APISO): 84 U/L (ref 31–125)
BUN: 7 mg/dL (ref 7–25)
CO2: 27 mmol/L (ref 20–32)
Calcium: 10 mg/dL (ref 8.6–10.2)
Chloride: 104 mmol/L (ref 98–110)
Creat: 0.58 mg/dL (ref 0.50–1.10)
GFR, Est African American: 152 mL/min/{1.73_m2} (ref >=60)
GFR, Est Non African American: 131 mL/min/{1.73_m2} (ref >=60)
Globulin: 2.7 g/dL (calc) (ref 1.9–3.7)
Glucose, Bld: 93 mg/dL (ref 65–99)
Potassium: 4.4 mmol/L (ref 3.5–5.3)
Sodium: 141 mmol/L (ref 135–146)
Total Bilirubin: 0.3 mg/dL (ref 0.2–1.2)
Total Protein: 7.4 g/dL (ref 6.1–8.1)

## 2020-06-05 LAB — LIPASE: Lipase: 73 U/L — ABNORMAL HIGH (ref 7–60)

## 2020-06-05 LAB — AMYLASE: Amylase: 73 U/L (ref 21–101)

## 2020-06-05 LAB — HCG, SERUM, QUALITATIVE: Preg, Serum: NEGATIVE

## 2020-06-09 ENCOUNTER — Ambulatory Visit: Payer: 59 | Admitting: Adult Health Nurse Practitioner

## 2020-06-10 ENCOUNTER — Other Ambulatory Visit: Payer: Self-pay | Admitting: Adult Health Nurse Practitioner

## 2020-06-10 DIAGNOSIS — R1030 Lower abdominal pain, unspecified: Secondary | ICD-10-CM

## 2020-06-10 DIAGNOSIS — N912 Amenorrhea, unspecified: Secondary | ICD-10-CM

## 2020-06-18 ENCOUNTER — Ambulatory Visit
Admission: RE | Admit: 2020-06-18 | Discharge: 2020-06-18 | Disposition: A | Discharge status: Home / Self Care | Payer: 59 | Source: Ambulatory Visit | Attending: Adult Health Nurse Practitioner | Admitting: Adult Health Nurse Practitioner

## 2020-06-18 DIAGNOSIS — N912 Amenorrhea, unspecified: Secondary | ICD-10-CM

## 2020-06-18 DIAGNOSIS — R1030 Lower abdominal pain, unspecified: Secondary | ICD-10-CM

## 2020-07-01 DIAGNOSIS — E282 Polycystic ovarian syndrome: Secondary | ICD-10-CM

## 2020-07-17 ENCOUNTER — Encounter: Payer: Self-pay | Admitting: Internal Medicine

## 2020-08-28 ENCOUNTER — Other Ambulatory Visit: Payer: Self-pay

## 2020-08-28 ENCOUNTER — Ambulatory Visit (INDEPENDENT_AMBULATORY_CARE_PROVIDER_SITE_OTHER): Payer: BC Managed Care – PPO | Admitting: Internal Medicine

## 2020-08-28 ENCOUNTER — Encounter: Payer: Self-pay | Admitting: Internal Medicine

## 2020-08-28 VITALS — BP 128/82 | HR 85 | Ht 70.0 in | Wt 209.2 lb

## 2020-08-28 DIAGNOSIS — E282 Polycystic ovarian syndrome: Secondary | ICD-10-CM | POA: Diagnosis not present

## 2020-08-28 MED ORDER — XULANE 150-35 MCG/24HR TD PTWK: 1.0000 | MEDICATED_PATCH | TRANSDERMAL | 3 refills | Status: DC

## 2020-08-28 NOTE — Progress Notes (Addendum)
Patient ID: Patricia Davis, female   DOB: [birth year], 23 y.o.   MRN: [id number]   This visit occurred during the SARS-CoV-2 public health emergency.  Safety protocols were in place, including screening questions prior to the visit, additional usage of staff PPE, and extensive cleaning of exam room while observing appropriate contact time as indicated for disinfecting solutions.   HPI: Patricia Davis is a 25 y.o. female, referred by Dr. Oneta Rack, for evaluation and management of PCOS. She previously saw OB/GYN in [place], but not recently.  Fertility/Menstrual cycles: - menarche at 25 y/o - regular menses after menarche until 25 years old when she started [place] contraceptive patch - she stopped the patch in 03/2020 >> amenorrhea for 3 months - LMP 2.5 weeks ago - children: 0 - miscarriages: 0  During investigation for amenorrhea, she had a transvaginal ultrasound: - TV U/S (06/18/2020): Numerous follicles in both ovaries -seen in patients with PCOS  Patient complains of significant lower abdominal pain especially in the right pelvic area.  This is constant, and not necessarily related to her menstrual cycles  She was referred to endocrinology afterwards.  Acne: - back of buttock, upper legs developed after stopping Xulane - not on back or face  Hirsutism: - upper lip - occasionally has to shave - thicker hair on the rest of her body after stopping Xulane  Weight gain: -She lost 45 pounds in 2020-2021, gained 10 lbs lbs - no steroid use - no weight loss meds - Meals: - Breakfast: Water, sometimes with lemon - Lunch: Sandwich or salad - Dinner: Chicken with mashed potatoes + broccoli, burgers - Snacks: Cereal, popcorn, chocolate, 2 to 3/day - Diets tried: Tried to follow a low FODMAP elimination diet in 2018-2019 >> stopped onions afterwards - Exercise: stretching, pull ups, lift weights, hikes rarely, walks. Also, active at work.  Treatments tried: - did not try  Metformin - did not try Spironolactone - did not try Vaniqa - not on OCPs now  On Biotin 2500 mcg daily.  -Reviewed latest thyroid tests: Lab Results  Component Value Date   TSH 1.66 02/13/2020   -Reviewed latest set of lipids:    Component Value Date/Time   CHOL 249 (H) 02/13/2020 1612   TRIG 181 (H) 02/13/2020 1612   HDL 66 02/13/2020 1612   CHOLHDL 3.8 02/13/2020 1612   VLDL 30 01/21/2016 1429   LDLCALC 151 (H) 02/13/2020 1612  She has fatty liver on liver ultrasound.  She also has a history of an elevated lipase in 06/2020.  -Reviewed latest HbA1c: Lab Results  Component Value Date   HGBA1C 5.0 02/13/2020   She has FH of PCOS.   She has a history of pseudotumor cerebri, major depressive disorder, anxiety.  She also has a low vitamin D: 02/2020: Vitamin D 23.  She is taking a vitamin D supplement  ROS: Constitutional: + weight loss and then gain (see HPI), + fatigue, + subjective hypothermia Eyes: no blurry vision, no xerophthalmia ENT: no sore throat, no nodules palpated in neck, no dysphagia/odynophagia, no hoarseness, + tinnitus Cardiovascular: no CP/SOB/palpitations/leg swelling Respiratory: no cough/SOB Gastrointestinal: + N/no V/D/C, + heartburn Musculoskeletal: + Both muscle/joint aches Skin: no acne on face, + hair on upper lip, no new, wide stretch marks stretch marks Neurological: no tremors/numbness/tingling/dizziness, + headaches Psychiatric: + Both: Depression/anxiety  Past Medical History:  Diagnosis Date  . Anxiety   . Major depressive disorder 2014  . Pseudotumor cerebri 2010   Past Surgical History:  Procedure Laterality Date  .  WISDOM TOOTH EXTRACTION  05/2014   Social History   Socioeconomic History  . Marital status: Single    Spouse name: Not on file  . Number of children: 0  . Years of education: 812  . Highest education level: GED or equivalent  Occupational History  . 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 Smoking status: Former Smoker    Types: Cigarettes    Quit date: 2020    Years since quitting: 2.0  . Smokeless tobacco: Never Used  Vaping Use  . Vaping Use: Some days  . Start date: 08/09/2012  Substance and Sexual Activity  . Alcohol use: Yes    Alcohol/week: 0.0 standard drinks    Comment: 1-4 drinks 0-3 nights a week  . Drug use: Yes    Types: Marijuana    Comment: 3-4 times per week  . Sexual activity: Yes    Partners: Male    Birth control/protection: Patch  Other Topics Concern  . Not on file  Social History Narrative   In a relationship   Social Determinants of Health   Financial Resource Strain: Not on file  Food Insecurity: Not on file  Transportation Needs: Not on file  Physical Activity: Not on file  Stress: Not on file  Social Connections: Not on file  Intimate Partner Violence: Not on file   Current Outpatient Medications on File Prior to Visit  Medication Sig Dispense Refill  . aspirin EC 81 MG tablet Take 81 mg by mouth daily.    Marland Kitchen. BIOTIN PO Take by mouth.    . busPIRone (BUSPAR) 5 MG tablet Take       1 tablet      2 x /day       for  Chronic Anxiety 180 tablet 1  . Cholecalciferol (VITAMIN D PO) Take by mouth.    . COLLAGEN PO Take by mouth.    . Melatonin 10 MG CAPS Take by mouth.    . Omega-3 Fatty Acids (FISH OIL PO) Take by mouth daily.    Marland Kitchen. venlafaxine XR (EFFEXOR-XR) 75 MG 24 hr capsule Take 3 capsules by mouth daily with breakfast. 270 capsule 1  . vitamin B-12 (CYANOCOBALAMIN) 500 MCG tablet Take 500 mcg by mouth daily.    Burr Medico. XULANE 150-35 MCG/24HR transdermal patch Apply one patch topically once a week. 12 patch 1   No current facility-administered medications on file prior to visit.   Allergies  Allergen Reactions  . Sertraline     Other reaction(s): Laryngeal Edema (ALLERGY)  . Bactrim [Sulfamethoxazole-Trimethoprim] Other (See Comments)    Nausea, yeast   Family History  Problem Relation Age of Onset  .  Depression Mother   . Hypothyroidism Mother   . Gallbladder disease Mother   . Hypertension Mother   . Depression Father   . Gallbladder disease Father   . Hypertension Father   . Pancreatic cancer Maternal Grandfather   . Colon cancer Maternal Grandfather   . Diverticulitis Maternal Great-grandmother   . Diverticulitis Maternal Aunt   . Diabetes Paternal Grandmother   . Hypertension Paternal Grandmother     PE: BP 128/82   Pulse 85   Ht 5\' 10"  (1.778 m)   Wt 209 lb 3.2 oz (94.9 kg)   SpO2 97%   BMI 30.02 kg/m  Wt Readings from Last 3 Encounters:  08/28/20 209 lb 3.2 oz (94.9 kg)  06/04/20 208 lb (94.3 kg)  02/13/20 200 lb 12.8  oz (91.1 kg)   Constitutional: overweight, in NAD, no full supraclavicular fat pads Eyes: PERRLA, EOMI, no exophthalmos ENT: moist mucous membranes, no thyromegaly, no cervical lymphadenopathy Cardiovascular: RRR, No MRG Respiratory: CTA B Gastrointestinal: abdomen soft, NT, ND, BS+ Musculoskeletal: no deformities, strength intact in all 4 Skin: moist, warm; 1 acne spots face (chin), no dark terminal hair on chin, no vellum on sideburns, no skin tags, no acanthosis nigricans, no purple, wide, stretch marks Neurological: no tremor with outstretched hands, DTR normal in all 4  ASSESSMENT: 1. PCOS  PLAN: 1.  Pt with history of 3 months of amenorrhea after stopping contraceptive patch, but with return of menses earlier this month.  During investigation for amenorrhea, she was sent for transvaginal ultrasound that showed multiple small, follicular cysts, reminiscent of PCOS.  She did not have biochemical investigation for this.  She does not have significant hirsutism and only has acne on the back of her legs.  These started after stopping Xulane patch.  She mentions that she stopped the patch as she was in a relationship before and was contemplating a pregnancy.  However, this is not being done for now.  -At today's visit, we discussed that about the  PCOS syndrome.  This is caused by a dysfunction in the pituitary gonadal pulse generator in which LH and FSH hormones are secreted in pulses with an abnormal frequency.  As a consequence, patients may have an excess of estrogen and testosterone. - These hormonal abnormalities can result in a sum of several conditions, including:  weight gain  insulin resistance (and therefore a higher risk of developing diabetes later in life)  acne  hirsutism  irregular menstrual cycles  decreased fertility. - We also discussed about the fact that the treatment is usually targeted to addressing the problem that concerns the patient the most: acne/hirsutism, weight gain, or fertility, but there is no single treatment for PCOS.  - The first-line therapy are oral contraceptives. If she is concerned with her weight, we can use metformin; if she is concerned about acne/hirsutism, we can add spironolactone; and if she is concerned about fertility, I could refer her to reproductive endocrinology for possible use of clomiphene.  However, I also recommended a referral to nutrition, since her diet can be improved.  She agrees with that but is not sure whether she cannot afford it.  She will check into this.  I also advised her to include more cardio into her exercise routine, since she appears to do mostly weightlifting. -At today's visit, however, we discussed that we did not clearly establish a diagnosis of PCOS.  Discussed about checking: Orders Placed This Encounter  Procedures  . Testosterone Free with SHBG  . Prolactin  . Follicle stimulating hormone  . Estradiol  . DHEA-Sulfate, Serum  . Luteinizing hormone  . 17-Hydroxyprogesterone  . Amb ref to Medical Nutrition Therapy-MNT  -At today's visit, she is 2.5 weeks after the start of her last menstrual period.  I advised her to call us when her next menstrual cycle starts and schedule labs 3 to 5 days after the start of the cycle.  I also advised her to be off  biotin for 3 to 5 days before the labs.  She is taking 2500 mcg daily and this can influence hormonal results. -regarding her pelvic pain, this does appear to be related to her ovarian cysts and not to fibroids per review of her recent transvaginal ultrasound report.  I did recommend to start back on  Burr Medico, which she agrees.  I explained that this can improve her acne, hirsutism, and pelvic pain.  Indeed, she mentions that the pain started after she stopped the patch.  I did send a prescription to her pharmacy but advised her to hold off starting it until we can draw her labs. -We also discussed that she may benefit establishing care with OB/GYN in the future.  Component     Latest Ref Rng & Units 09/10/2020  Testosterone, Serum (Total)     ng/dL 59 (H)  % Free Testosterone     % 0.4  Free Testosterone, S     pg/mL 2.4  Sex Hormone Binding Globulin     nmol/L 134.1 (H)  Estradiol, Free     0.4-5.55 pg/mL 0.92  Estradiol     pg/mL 60  17-OH-Progesterone, LC/MS/MS     * ng/dL 43  Prolactin     ng/mL 8.3  LH     mIU/mL 5.27  FSH     mIU/ML 7.0  DHEA-Sulfate, LCMS     ug/dL 001   All labs are normal.  LH: FSH ratio is not elevated.  Free testosterone level is normal.   Free estradiol level is normal. SHBG is elevated, which is excellent.  Prolactin is normal, ruling out hyperprolactinemia.  17 hydroxyprogesterone is not elevated, ruling out CAH.  Most likely mild PCOS. For now, I would advise her to restart Xulane.  Carlus Pavlov, MD PhD [facility]

## 2020-08-28 NOTE — Patient Instructions (Addendum)
Please come back for labs in the 3-5 days after the start of your next cycle.  Stop Biotin 3-5 days before labs.  Please schedule an appt with Oran Rein with nutrition.  Please come back in 6 months for another visit.   Polycystic Ovary Syndrome  Polycystic ovarian syndrome (PCOS) is a common hormonal disorder among women of reproductive age. In most women with PCOS, small fluid-filled sacs (cysts) grow on the ovaries. PCOS can cause problems with menstrual periods and make it hard to get and stay pregnant. If this condition is not treated, it can lead to serious health problems, such as diabetes and heart disease. What are the causes? The cause of this condition is not known. It may be due to certain factors, such as:  Irregular menstrual cycle.  High levels of certain hormones.  Problems with the hormone that helps to control blood sugar (insulin).  Certain genes. What increases the risk? You are more likely to develop this condition if you:  Have a family history of PCOS or type 2 diabetes.  Are overweight, eat unhealthy foods, and are not active. These factors may cause problems with blood sugar control, which can contribute to PCOS or PCOS symptoms. What are the signs or symptoms? Symptoms of this condition include:  Ovarian cysts and sometimes pelvic pain.  Menstrual periods that are not regular or are too heavy.  Inability to get or stay pregnant.  Increased growth of hair on the face, chest, stomach, back, thumbs, thighs, or toes.  Acne or oily skin. Acne may develop during adulthood, and it may not get better with treatment.  Weight gain or obesity.  Patches of thickened and dark brown or black skin on the neck, arms, breasts, or thighs. How is this diagnosed? This condition is diagnosed based on:  Your medical history.  A physical exam that includes a pelvic exam. Your health care provider may look for areas of increased hair growth on your skin.  Tests,  such as: ? An ultrasound to check the ovaries for cysts and to view the lining of the uterus. ? Blood tests to check levels of sugar (glucose), female hormone (testosterone), and female hormones (estrogen and progesterone). How is this treated? There is no cure for this condition, but treatment can help to manage symptoms and prevent more health problems from developing. Treatment varies depending on your symptoms and if you want to have a baby or if you need birth control. Treatment may include:  Making nutrition and lifestyle changes.  Taking the progesterone hormone to start a menstrual period.  Taking birth control pills to help you have regular menstrual periods.  Taking medicines such as: ? Medicines to make you ovulate, if you want to get pregnant. ? Medicine to reduce extra hair growth.  Having surgery in severe cases. This may involve making small holes in one or both of your ovaries. This decreases the amount of testosterone that your body makes. Follow these instructions at home:  Take over-the-counter and prescription medicines only as told by your health care provider.  Follow a healthy meal plan that includes lean proteins, complex carbohydrates, fresh fruits and vegetables, low-fat dairy products, healthy fats, and fiber.  If you are overweight, lose weight as told by your health care provider. Your health care provider can determine how much weight loss is best for you and can help you lose weight safely.  Keep all follow-up visits. This is important. Contact a health care provider if:  Your  symptoms do not get better with medicine.  Your symptoms get worse or you develop new symptoms. Summary  Polycystic ovarian syndrome (PCOS) is a common hormonal disorder among women of reproductive age.  PCOS can cause problems with menstrual periods and make it hard to get and stay pregnant.  If this condition is not treated, it can lead to serious health problems, such as  diabetes and heart disease.  There is no cure for this condition, but treatment can help to manage symptoms and prevent more health problems from developing. This information is not intended to replace advice given to you by your health care provider. Make sure you discuss any questions you have with your health care provider. Document Revised: 01/03/2020 Document Reviewed: 01/03/2020 Elsevier Patient Education  2021 [employer].

## 2020-08-29 ENCOUNTER — Encounter: Payer: Self-pay | Admitting: Internal Medicine

## 2020-09-01 ENCOUNTER — Other Ambulatory Visit: Payer: Self-pay | Admitting: Adult Health

## 2020-09-10 ENCOUNTER — Other Ambulatory Visit (INDEPENDENT_AMBULATORY_CARE_PROVIDER_SITE_OTHER): Payer: BC Managed Care – PPO

## 2020-09-10 ENCOUNTER — Other Ambulatory Visit: Payer: Self-pay

## 2020-09-10 ENCOUNTER — Other Ambulatory Visit: Payer: Self-pay | Admitting: Internal Medicine

## 2020-09-10 DIAGNOSIS — E282 Polycystic ovarian syndrome: Secondary | ICD-10-CM

## 2020-09-10 LAB — FOLLICLE STIMULATING HORMONE: FSH: 7 m[IU]/mL

## 2020-09-10 LAB — LUTEINIZING HORMONE: LH: 5.27 m[IU]/mL

## 2020-09-19 LAB — TESTOSTERONE, FREE AND TOTAL (INCLUDES SHBG)-(MALES)
% Free Testosterone: 0.4 %
Free Testosterone, S: 2.4 pg/mL
Sex Hormone Binding Globulin: 134.1 nmol/L — ABNORMAL HIGH
Testosterone, Serum (Total): 59 ng/dL — ABNORMAL HIGH

## 2020-09-19 LAB — DHEA-SULFATE, SERUM: DHEA-Sulfate, LCMS: 260 ug/dL

## 2020-09-21 LAB — 17-HYDROXYPROGESTERONE: 17-OH-Progesterone, LC/MS/MS: 43 ng/dL

## 2020-09-21 LAB — PROLACTIN: Prolactin: 8.3 ng/mL

## 2020-09-21 LAB — ESTRADIOL, FREE
Estradiol, Free: 0.92 pg/mL
Estradiol: 60 pg/mL

## 2020-10-15 ENCOUNTER — Ambulatory Visit (HOSPITAL_COMMUNITY)
Admission: EM | Admit: 2020-10-15 | Discharge: 2020-10-15 | Disposition: A | Discharge status: Home / Self Care | Payer: BC Managed Care – PPO | Attending: Family Medicine | Admitting: Family Medicine

## 2020-10-15 ENCOUNTER — Emergency Department (HOSPITAL_BASED_OUTPATIENT_CLINIC_OR_DEPARTMENT_OTHER)
Admission: EM | Admit: 2020-10-15 | Discharge: 2020-10-15 | Disposition: A | Discharge status: Home / Self Care | Payer: BC Managed Care – PPO | Attending: Emergency Medicine | Admitting: Emergency Medicine

## 2020-10-15 ENCOUNTER — Emergency Department (HOSPITAL_BASED_OUTPATIENT_CLINIC_OR_DEPARTMENT_OTHER): Payer: BC Managed Care – PPO

## 2020-10-15 ENCOUNTER — Encounter (HOSPITAL_COMMUNITY): Payer: Self-pay

## 2020-10-15 ENCOUNTER — Encounter (HOSPITAL_BASED_OUTPATIENT_CLINIC_OR_DEPARTMENT_OTHER): Payer: Self-pay | Admitting: Emergency Medicine

## 2020-10-15 ENCOUNTER — Other Ambulatory Visit: Payer: Self-pay

## 2020-10-15 DIAGNOSIS — K8020 Calculus of gallbladder without cholecystitis without obstruction: Secondary | ICD-10-CM | POA: Diagnosis not present

## 2020-10-15 DIAGNOSIS — R1011 Right upper quadrant pain: Secondary | ICD-10-CM | POA: Diagnosis not present

## 2020-10-15 DIAGNOSIS — K824 Cholesterolosis of gallbladder: Secondary | ICD-10-CM | POA: Diagnosis not present

## 2020-10-15 DIAGNOSIS — K219 Gastro-esophageal reflux disease without esophagitis: Secondary | ICD-10-CM | POA: Diagnosis not present

## 2020-10-15 DIAGNOSIS — K8080 Other cholelithiasis without obstruction: Secondary | ICD-10-CM | POA: Insufficient documentation

## 2020-10-15 DIAGNOSIS — Z87891 Personal history of nicotine dependence: Secondary | ICD-10-CM | POA: Diagnosis not present

## 2020-10-15 DIAGNOSIS — Z86011 Personal history of benign neoplasm of the brain: Secondary | ICD-10-CM | POA: Insufficient documentation

## 2020-10-15 DIAGNOSIS — R1010 Upper abdominal pain, unspecified: Secondary | ICD-10-CM | POA: Diagnosis not present

## 2020-10-15 DIAGNOSIS — Z7982 Long term (current) use of aspirin: Secondary | ICD-10-CM | POA: Diagnosis not present

## 2020-10-15 DIAGNOSIS — I10 Essential (primary) hypertension: Secondary | ICD-10-CM

## 2020-10-15 HISTORY — DX: Polycystic ovarian syndrome: E28.2

## 2020-10-15 LAB — POCT URINALYSIS DIPSTICK, ED / UC
Bilirubin Urine: NEGATIVE
Glucose, UA: NEGATIVE mg/dL
Hgb urine dipstick: NEGATIVE
Ketones, ur: NEGATIVE mg/dL
Leukocytes,Ua: NEGATIVE
Nitrite: NEGATIVE
Protein, ur: NEGATIVE mg/dL
Specific Gravity, Urine: 1.015 (ref 1.005–1.030)
Urobilinogen, UA: 0.2 mg/dL (ref 0.0–1.0)
pH: >=9 (ref 5.0–8.0)

## 2020-10-15 LAB — CBC
HCT: 43 % (ref 36.0–46.0)
Hemoglobin: 14.5 g/dL (ref 12.0–15.0)
MCH: 30.3 pg (ref 26.0–34.0)
MCHC: 33.7 g/dL (ref 30.0–36.0)
MCV: 89.8 fL (ref 80.0–100.0)
Platelets: 332 10*3/uL (ref 150–400)
RBC: 4.79 MIL/uL (ref 3.87–5.11)
RDW: 12.8 % (ref 11.5–15.5)
WBC: 13.3 10*3/uL — ABNORMAL HIGH (ref 4.0–10.5)
nRBC: 0 % (ref 0.0–0.2)

## 2020-10-15 LAB — COMPREHENSIVE METABOLIC PANEL
ALT: 14 U/L (ref 0–44)
AST: 15 U/L (ref 15–41)
Albumin: 4.6 g/dL (ref 3.5–5.0)
Alkaline Phosphatase: 69 U/L (ref 38–126)
Anion gap: 11 (ref 5–15)
BUN: 10 mg/dL (ref 6–20)
CO2: 27 mmol/L (ref 22–32)
Calcium: 9.7 mg/dL (ref 8.9–10.3)
Chloride: 102 mmol/L (ref 98–111)
Creatinine, Ser: 0.71 mg/dL (ref 0.44–1.00)
GFR, Estimated: >60 mL/min (ref >60)
Glucose, Bld: 99 mg/dL (ref 70–99)
Potassium: 3.6 mmol/L (ref 3.5–5.1)
Sodium: 140 mmol/L (ref 135–145)
Total Bilirubin: 0.3 mg/dL (ref 0.3–1.2)
Total Protein: 7.5 g/dL (ref 6.5–8.1)

## 2020-10-15 LAB — POC URINE PREG, ED: Preg Test, Ur: NEGATIVE

## 2020-10-15 LAB — LIPASE, BLOOD: Lipase: 15 U/L (ref 11–51)

## 2020-10-15 MED ORDER — ONDANSETRON 4 MG PO TBDP: 4.0000 mg | ORAL_TABLET | Freq: Three times a day (TID) | ORAL | 0 refills | Status: DC | PRN

## 2020-10-15 MED ORDER — ONDANSETRON 4 MG PO TBDP
4.0000 mg | ORAL_TABLET | Freq: Once | ORAL | Status: AC
  Administered 2020-10-15: 4 mg via ORAL
  Filled 2020-10-15: qty 1

## 2020-10-15 MED ORDER — OXYCODONE-ACETAMINOPHEN 5-325 MG PO TABS
2.0000 | ORAL_TABLET | Freq: Once | ORAL | Status: AC
  Administered 2020-10-15: 2 via ORAL
  Filled 2020-10-15: qty 2

## 2020-10-15 MED ORDER — MORPHINE SULFATE (PF) 4 MG/ML IV SOLN
INTRAVENOUS | Status: AC
  Filled 2020-10-15: qty 1

## 2020-10-15 MED ORDER — OXYCODONE-ACETAMINOPHEN 5-325 MG PO TABS: 2.0000 | ORAL_TABLET | ORAL | 0 refills | Status: DC | PRN

## 2020-10-15 MED ORDER — MORPHINE SULFATE (PF) 2 MG/ML IV SOLN: 4.0000 mg | Freq: Once | INTRAVENOUS | Status: DC

## 2020-10-15 MED ORDER — ONDANSETRON 4 MG PO TBDP
ORAL_TABLET | ORAL | Status: AC
  Filled 2020-10-15: qty 1

## 2020-10-15 MED ORDER — ONDANSETRON 4 MG PO TBDP
4.0000 mg | ORAL_TABLET | Freq: Once | ORAL | Status: AC
  Administered 2020-10-15: 4 mg via ORAL

## 2020-10-15 NOTE — ED Triage Notes (Signed)
Pt in with c/o RUQ pain and vomiting that has been going on for 7 days now.  Pt states she also feel the pain in her back and shoulder blades

## 2020-10-15 NOTE — ED Provider Notes (Addendum)
[facility] CARE CENTER   [id number] 10/15/20 Arrival Time: 1340  ASSESSMENT & PLAN:  1. Abdominal pain, right upper quadrant   2. Elevated blood pressure reading in office with diagnosis of hypertension     To ED for further evaluation and imaging, esp with low grade temp. By private vehicle. Stable upon discharge. UPT negative.  Meds ordered this encounter  Medications  . ondansetron (ZOFRAN-ODT) disintegrating tablet 4 mg  . morphine 2 MG/ML injection 4 mg     Follow-up Information    Go to  [facility] EMERGENCY DEPARTMENT.   Specialty: Emergency Medicine Contact information: [address] [id number] mc [place] [address] [phone]              Elevated BP noted. She has been told this in the past.   Reviewed expectations re: course of current medical issues. Questions answered. Outlined signs and symptoms indicating need for more acute intervention. Patient verbalized understanding. After Visit Summary given.   SUBJECTIVE: History from: patient. Patricia Davis is a 25 y.o. female who presents with complaint of fairly persistent but waxing/waning RUQ abd pain; onset approx 1 w ago; with radiation to shoulder blade; with nausea and non-bloody emesis that is becoming more frequent. Notes increase in pain past 24 hours. Questions subj fever today. Normal bowel habits without blood. No OTC tx reported. No h/o abd surgery.  Past Surgical History:  Procedure Laterality Date  . WISDOM TOOTH EXTRACTION  05/2014   No illegal drug use.  Social History   Substance and Sexual Activity  Alcohol Use Yes  . Alcohol/week: 0.0 standard drinks   Comment: 1-4 drinks 0-3 nights a week     OBJECTIVE:  Vitals:   10/15/20 1419 10/15/20 1420  BP:  (!) 147/107  Pulse: 79   Resp: 18   Temp: 100 F (37.8 C)   SpO2: 97%     General appearance: alert, oriented, no acute distress but appears uncomfortable HEENT: [place]; AT;  oropharynx moist Lungs: unlabored respirations Abdomen: soft; without distention; with TTP over RUQ; without masses or organomegaly; without guarding or rebound tenderness Back: without reported CVA tenderness; FROM at waist Extremities: without LE edema; symmetrical; without gross deformities Skin: warm and dry Neurologic: normal gait Psychological: alert and cooperative; normal mood and affect  Labs: Results for orders placed or performed during the hospital encounter of 10/15/20  POC urine pregnancy  Result Value Ref Range   Preg Test, Ur NEGATIVE NEGATIVE  POC Urinalysis dipstick  Result Value Ref Range   Glucose, UA NEGATIVE NEGATIVE mg/dL   Bilirubin Urine NEGATIVE NEGATIVE   Ketones, ur NEGATIVE NEGATIVE mg/dL   Specific Gravity, Urine 1.015 1.005 - 1.030   Hgb urine dipstick NEGATIVE NEGATIVE   pH >=9.0 5.0 - 8.0   Protein, ur NEGATIVE NEGATIVE mg/dL   Urobilinogen, UA 0.2 0.0 - 1.0 mg/dL   Nitrite NEGATIVE NEGATIVE   Leukocytes,Ua NEGATIVE NEGATIVE   Labs Reviewed  POC URINE PREG, ED  POCT URINALYSIS DIPSTICK, ED / UC    Allergies  Allergen Reactions  . Sertraline     Other reaction(s): Laryngeal Edema (ALLERGY)  . Bactrim [Sulfamethoxazole-Trimethoprim] Other (See Comments)    Nausea, yeast                                               Past Medical History:  Diagnosis  Date  . Anxiety   . Major depressive disorder 2014  . PCOS (polycystic ovarian syndrome)   . Pseudotumor cerebri 2010    Social History   Socioeconomic History  . Marital status: Single    Spouse name: Not on file  . Number of children: 0  . Years of education: 89  . Highest education level: GED or equivalent  Occupational History  . Occupation: International aid/development worker at [employer], educational  Tobacco Use  . Smoking status: Former Smoker    Types: Cigarettes    Quit date: 2020    Years since quitting: 2.1  . Smokeless tobacco: Never Used  Vaping Use  . Vaping Use: Some days   . Start date: 08/09/2012  Substance and Sexual Activity  . Alcohol use: Yes    Alcohol/week: 0.0 standard drinks    Comment: 1-4 drinks 0-3 nights a week  . Drug use: Yes    Types: Marijuana    Comment: 3-4 times per week  . Sexual activity: Yes    Partners: Male    Birth control/protection: Patch  Other Topics Concern  . Not on file  Social History Narrative   In a relationship   Social Determinants of Health   Financial Resource Strain: Not on file  Food Insecurity: Not on file  Transportation Needs: Not on file  Physical Activity: Not on file  Stress: Not on file  Social Connections: Not on file  Intimate Partner Violence: Not on file    Family History  Problem Relation Age of Onset  . Depression Mother   . Hypothyroidism Mother   . Gallbladder disease Mother   . Hypertension Mother   . Depression Father   . Gallbladder disease Father   . Hypertension Father   . Pancreatic cancer Maternal Grandfather   . Colon cancer Maternal Grandfather   . Diverticulitis Maternal Great-grandmother   . Diverticulitis Maternal Aunt   . Diabetes Paternal Grandmother   . Hypertension Paternal Dulce Sellar, MD 10/15/20 1456    Mardella Layman, MD 10/15/20 [phone]

## 2020-10-15 NOTE — ED Provider Notes (Signed)
[facility] EMERGENCY DEPT Provider Note   CSN: [id number] Arrival date & time: 10/15/20  1538     History Chief Complaint  Patient presents with  . Abdominal Pain    Patricia Davis is a 25 y.o. female.  25 year old female with past medical history below including PCOS, pseudotumor cerebri, anxiety/depression, migraines, obesity who presents with abdominal pain and vomiting.  For the past 1 week, patient has been having intermittent episodes of abdominal pain that usually occur at night an hour or 2 after she eats dinner.  Pain is in right upper abdomen.  She has been waking up around 4 or 5 AM due to the pain and having nausea and vomiting.  Pain has been worsening throughout the week.  She reports some loose stools but no severe diarrhea.  She was seen at urgent care urgent care today where she had a temp of 100, denies any fevers at home.  No urinary symptoms, vaginal bleeding/discharge.  At the urgent care, she received Zofran and morphine prior to being sent here for further evaluation.  Family history notable for mother with cholelithiasis at a very young age requiring cholecystectomy.  The history is provided by the patient.  Abdominal Pain      Past Medical History:  Diagnosis Date  . Anxiety   . Major depressive disorder 2014  . PCOS (polycystic ovarian syndrome)   . Pseudotumor cerebri 2010    Patient Active Problem List   Diagnosis Date Noted  . Vitamin D deficiency 11/16/2017  . Obesity (BMI 30.0-34.9) 11/16/2017  . Hyperlipidemia 11/16/2017  . Elevated BP without diagnosis of hypertension 11/16/2017  . GERD (gastroesophageal reflux disease) 01/21/2016  . Episodic paroxysmal anxiety disorder 12/27/2012  . Migraine without aura and responsive to treatment 10/18/2011    Past Surgical History:  Procedure Laterality Date  . WISDOM TOOTH EXTRACTION  05/2014     OB History   No obstetric history on file.     Family History  Problem Relation  Age of Onset  . Depression Mother   . Hypothyroidism Mother   . Gallbladder disease Mother   . Hypertension Mother   . Depression Father   . Gallbladder disease Father   . Hypertension Father   . Pancreatic cancer Maternal Grandfather   . Colon cancer Maternal Grandfather   . Diverticulitis Maternal Great-grandmother   . Diverticulitis Maternal Aunt   . Diabetes Paternal Grandmother   . Hypertension Paternal Grandmother     Social History   Tobacco Use  . Smoking status: Former Smoker    Types: Cigarettes    Quit date: 2020    Years since quitting: 2.1  . Smokeless tobacco: Never Used  Vaping Use  . Vaping Use: Some days  . Start date: 08/09/2012  Substance Use Topics  . Alcohol use: Yes    Alcohol/week: 0.0 standard drinks    Comment: 1-4 drinks 0-3 nights a week  . Drug use: Yes    Types: Marijuana    Comment: 3-4 times per week    Home Medications Prior to Admission medications   Medication Sig Start Date End Date Taking? Authorizing Provider  ondansetron (ZOFRAN ODT) 4 MG disintegrating tablet Take 1 tablet (4 mg total) by mouth every 8 (eight) hours as needed for nausea or vomiting. 10/15/20  Yes Aziyah Provencal, Ambrose Finland, MD  oxyCODONE-acetaminophen (PERCOCET) 5-325 MG tablet Take 2 tablets by mouth every 4 (four) hours as needed for up to 5 days. 10/15/20 10/20/20 Yes Mccall Will, Ambrose Finland, MD  aspirin EC 81 MG tablet Take 81 mg by mouth daily.    [provider]  BIOTIN PO Take by mouth.    [provider]  busPIRone (BUSPAR) 5 MG tablet Take       1 tablet      2 x /day       for  Chronic Anxiety 05/30/20   Lucky Cowboy, MD  Cholecalciferol (VITAMIN D PO) Take by mouth.    [provider]  COLLAGEN PO Take by mouth.    [provider]  Melatonin 10 MG CAPS Take by mouth.    [provider]  norelgestromin-ethinyl estradiol Burr Medico) 150-35 MCG/24HR transdermal patch Place 1 patch onto the skin once a week. 08/28/20   Carlus Pavlov, MD  Omega-3 Fatty Acids (FISH OIL PO) Take by mouth daily.    [provider]  venlafaxine XR (EFFEXOR-XR) 75 MG 24 hr capsule Take 3 capsules by mouth daily with breakfast. 04/02/20   Doree Albee, PA-C  vitamin B-12 (CYANOCOBALAMIN) 500 MCG tablet Take 500 mcg by mouth daily.    [provider]  Burr Medico 150-35 MCG/24HR transdermal patch Apply one patch topically once a week. 03/01/20   Judd Gaudier, NP    Allergies    Sertraline and Bactrim [sulfamethoxazole-trimethoprim]  Review of Systems   Review of Systems  Gastrointestinal: Positive for abdominal pain.   All other systems reviewed and are negative except that which was mentioned in HPI  Physical Exam Updated Vital Signs BP (!) 140/105   Pulse 90   Temp 98.6 F (37 C) (Oral)   Resp 18   Ht 5\' 10"  (1.778 m)   Wt 92.5 kg   LMP 10/08/2020   SpO2 98%   BMI 29.27 kg/m   Physical Exam Constitutional:      General: She is not in acute distress.    Appearance: Normal appearance.  HENT:     Head: Normocephalic and atraumatic.  Eyes:     Conjunctiva/sclera: Conjunctivae normal.  Cardiovascular:     Rate and Rhythm: Normal rate and regular rhythm.     Heart sounds: Normal heart sounds. No murmur heard.   Pulmonary:     Effort: Pulmonary effort is normal.     Breath sounds: Normal breath sounds.  Abdominal:     General: Abdomen is flat. Bowel sounds are normal. There is no distension.     Palpations: Abdomen is soft.     Tenderness: There is abdominal tenderness in the right upper quadrant. There is no guarding or rebound. Negative signs include Murphy's sign.  Musculoskeletal:     Right lower leg: No edema.     Left lower leg: No edema.  Skin:    General: Skin is warm and dry.  Neurological:     Mental Status: She is alert and oriented to person, place, and time.     Comments: fluent  Psychiatric:        Mood and Affect: Mood normal.        Behavior: Behavior normal.     ED  Results / Procedures / Treatments   Labs (all labs ordered are listed, but only abnormal results are displayed) Labs Reviewed  CBC - Abnormal; Notable for the following components:      Result Value   WBC 13.3 (*)    All other components within normal limits  LIPASE, BLOOD  COMPREHENSIVE METABOLIC PANEL    EKG None  Radiology 12/08/2020 Abdomen Limited  Result Date: 10/15/2020  CLINICAL DATA:  Right upper quadrant pain EXAM: ULTRASOUND ABDOMEN LIMITED RIGHT UPPER QUADRANT COMPARISON:  07/04/2018 FINDINGS: Gallbladder: Gallbladder is partially distended with results wall thickening. Negative sonographic Murphy's sign is elicited. Gall stones and gallbladder polyps are noted. Common bile duct: Diameter: 5 mm. Liver: No focal lesion identified. Within normal limits in parenchymal echogenicity. Portal vein is patent on color Doppler imaging with normal direction of blood flow towards the liver. Other: None. IMPRESSION: Gallbladder is mildly decompressed. Gallbladder polyps and stones are seen. Electronically Signed   By: Alcide Clever M.D.   On: 10/15/2020 17:42    Procedures Procedures   Medications Ordered in ED Medications  ondansetron (ZOFRAN-ODT) disintegrating tablet 4 mg (4 mg Oral Given 10/15/20 1838)  oxyCODONE-acetaminophen (PERCOCET/ROXICET) 5-325 MG per tablet 2 tablet (2 tablets Oral Given 10/15/20 1837)    ED Course  I have reviewed the triage vital signs and the nursing notes.  Pertinent labs & imaging results that were available during my care of the patient were reviewed by me and considered in my medical decision making (see chart for details).    MDM Rules/Calculators/A&P                          Well-appearing on exam, hypertensive but afebrile, no acute distress.  Focal right upper quadrant tenderness noted.  Suspect gallbladder pathology, differential includes pancreatitis or kidney stone.  Lab work shows WBC 13.3, it appears that her white count is often slightly elevated  on previous labs.  CMP unremarkable including normal LFTs and lipase.  UA and pregnancy test at urgent care today were normal.  Obtained abdominal ultrasound which shows decompressed gallbladder with polyps and stones.  No evidence of cholecystitis or choledocholithiasis.  Patient's pain improved on reassessment and has been able to drink fluids without problems.  She feels comfortable going home and following up with general surgery as an outpatient.  I have counseled on supportive measures including low-fat diet, mostly liquids with slow advancement as tolerated, and avoidance of large meals.  I extensively reviewed return cautions including intractable pain, intractable vomiting, or fever.  Provided with pain and nausea medicines to use as needed.  Patient voiced understanding of plan. Final Clinical Impression(s) / ED Diagnoses Final diagnoses:  Symptomatic cholelithiasis    Rx / DC Orders ED Discharge Orders         Ordered    ondansetron (ZOFRAN ODT) 4 MG disintegrating tablet  Every 8 hours PRN        10/15/20 1935    oxyCODONE-acetaminophen (PERCOCET) 5-325 MG tablet  Every 4 hours PRN        10/15/20 1935           Harris Kistler, Ambrose Finland, MD 10/15/20 1939

## 2020-10-15 NOTE — ED Notes (Signed)
Patient is being discharged from the Urgent Care and sent to the Emergency Department via POV . Per Dr. Tracie Harrier patient is in need of higher level of care due to abdominal pain/cholecystitis . Patient is aware and verbalizes understanding of plan of care.  Vitals:   10/15/20 1419 10/15/20 1420  BP:  (!) 147/107  Pulse: 79   Resp: 18   Temp: 100 F (37.8 C)   SpO2: 97%

## 2020-10-15 NOTE — ED Triage Notes (Addendum)
RUQ pain with vomiting x 7 days. Sent from UC. Pt was given ODT zofran at UC .UA also completed.

## 2020-10-15 NOTE — Discharge Instructions (Addendum)
Meds ordered this encounter  Medications   ondansetron (ZOFRAN-ODT) disintegrating tablet 4 mg   morphine 2 MG/ML injection 4 mg

## 2020-10-20 ENCOUNTER — Other Ambulatory Visit: Payer: Self-pay | Admitting: *Deleted

## 2020-10-20 ENCOUNTER — Other Ambulatory Visit: Payer: Self-pay | Admitting: Adult Health Nurse Practitioner

## 2020-10-20 DIAGNOSIS — K8020 Calculus of gallbladder without cholecystitis without obstruction: Secondary | ICD-10-CM

## 2020-10-20 DIAGNOSIS — R112 Nausea with vomiting, unspecified: Secondary | ICD-10-CM

## 2020-10-20 DIAGNOSIS — K819 Cholecystitis, unspecified: Secondary | ICD-10-CM

## 2020-10-20 MED ORDER — VENLAFAXINE HCL ER 75 MG PO CP24: ORAL_CAPSULE | ORAL | 1 refills | Status: DC

## 2020-10-20 MED ORDER — ONDANSETRON 8 MG PO TBDP: 4.0000 mg | ORAL_TABLET | Freq: Three times a day (TID) | ORAL | 1 refills | Status: DC | PRN

## 2020-10-20 MED ORDER — OXYCODONE-ACETAMINOPHEN 5-325 MG PO TABS
ORAL_TABLET | ORAL | 0 refills | Status: DC
Start: 2020-10-20 — End: 2021-03-27

## 2020-10-22 ENCOUNTER — Encounter: Payer: Self-pay | Admitting: Surgery

## 2020-10-22 ENCOUNTER — Other Ambulatory Visit (HOSPITAL_COMMUNITY)
Admission: RE | Admit: 2020-10-22 | Discharge: 2020-10-22 | Disposition: A | Discharge status: Home / Self Care | Payer: BC Managed Care – PPO | Source: Ambulatory Visit | Attending: Surgery | Admitting: Surgery

## 2020-10-22 ENCOUNTER — Ambulatory Visit: Payer: Self-pay | Admitting: Surgery

## 2020-10-22 DIAGNOSIS — Z20822 Contact with and (suspected) exposure to covid-19: Secondary | ICD-10-CM | POA: Insufficient documentation

## 2020-10-22 DIAGNOSIS — I10 Essential (primary) hypertension: Secondary | ICD-10-CM | POA: Diagnosis not present

## 2020-10-22 DIAGNOSIS — Z8379 Family history of other diseases of the digestive system: Secondary | ICD-10-CM | POA: Diagnosis not present

## 2020-10-22 DIAGNOSIS — Z888 Allergy status to other drugs, medicaments and biological substances status: Secondary | ICD-10-CM | POA: Diagnosis not present

## 2020-10-22 DIAGNOSIS — K8020 Calculus of gallbladder without cholecystitis without obstruction: Secondary | ICD-10-CM | POA: Diagnosis not present

## 2020-10-22 DIAGNOSIS — K219 Gastro-esophageal reflux disease without esophagitis: Secondary | ICD-10-CM | POA: Diagnosis not present

## 2020-10-22 DIAGNOSIS — K8010 Calculus of gallbladder with chronic cholecystitis without obstruction: Secondary | ICD-10-CM | POA: Diagnosis not present

## 2020-10-22 DIAGNOSIS — Z881 Allergy status to other antibiotic agents status: Secondary | ICD-10-CM | POA: Diagnosis not present

## 2020-10-22 DIAGNOSIS — Z87891 Personal history of nicotine dependence: Secondary | ICD-10-CM | POA: Diagnosis not present

## 2020-10-22 DIAGNOSIS — Z01812 Encounter for preprocedural laboratory examination: Secondary | ICD-10-CM | POA: Insufficient documentation

## 2020-10-22 DIAGNOSIS — K828 Other specified diseases of gallbladder: Secondary | ICD-10-CM | POA: Diagnosis not present

## 2020-10-22 DIAGNOSIS — Z8249 Family history of ischemic heart disease and other diseases of the circulatory system: Secondary | ICD-10-CM | POA: Diagnosis not present

## 2020-10-22 LAB — SARS CORONAVIRUS 2 (TAT 6-24 HRS): SARS Coronavirus 2: NEGATIVE

## 2020-10-22 NOTE — Progress Notes (Addendum)
COVID Vaccine Completed: Yes Date COVID Vaccine completed: 12/10/19 Has received booster: No COVID vaccine manufacturer: Pfizer     Date of COVID positive in last 90 days: No  PCP - Lucky Cowboy, MD Cardiologist - N/A  Chest x-ray - N/A EKG - greater than 1 year in epic Stress Test - N/A ECHO - N/A Cardiac Cath - N/A Pacemaker/ICD device last checked: N/A  Sleep Study - N/A CPAP - N/A  Fasting Blood Sugar - N/A Checks Blood Sugar _N/A____ times a day  Blood Thinner Instructions: N/A  Aspirin Instructions:N/A Last Dose:N/A  Activity level:  Able to exercise without symptoms     Anesthesia review: N/A  Patient denies shortness of breath, fever, cough and chest pain at PAT appointment   Patient verbalized understanding of instructions that were given to them at the PAT appointment. Patient was also instructed that they will need to review over the PAT instructions again at home before surgery.

## 2020-10-22 NOTE — H&P (Signed)
History of Present Illness (Patricia Davis L. Patricia Flinders MD; 10/22/2020 2:06 PM) The patient is a 25 year old female who presents for evaluation of gall stones.HPI: Patricia Davis is a 25 yo female who was referred with symptomatic cholelithiasis. For the last few weeks she has been having right flank pain, worse after eating. She reports subjective fevers. The pain has been getting worse and now occurs daily. She endorses persistent pain and nausea for the last few days. She was seen in the ED on 3/9, at which time WBC was 13 (however it has been 13-14 for several years) and LFTs and lipase were normal. A RUQ US showed a large gallstone but no pericholecystic fluid. She was discharged from the ED and referred for outpatient follow up. She has not had any previous abdominal surgeries.  PMH: pseudotumor cerebri, PCOS, depression  PSH: wisdom tooth extraction  FHx: Mother and grandmother had cholecystectomy in 20s for gallbladder disease.  Social: Occasional EtOH use, former smoker (quit 2020)    Past Surgical History (Patricia Davis, CMA; 10/22/2020 11:02 AM) Oral Surgery   Diagnostic Studies History (Patricia Davis, CMA; 10/22/2020 11:02 AM) Colonoscopy  1-5 years ago Mammogram  never Pap Smear  never  Allergies (Patricia Davis, CMA; 10/22/2020 11:05 AM) Bactrim *ANTI-INFECTIVE AGENTS - MISC.*  Zoloft *ANTIDEPRESSANTS*   Medication History (Patricia Davis, CMA; 10/22/2020 11:05 AM) Xulane (150-35MCG/24HR Patch Weekly, Transdermal) Active. Effexor (25MG Tablet, Oral) Active. Medications Reconciled  Social History (Patricia Davis, CMA; 10/22/2020 11:02 AM) Alcohol use  Moderate alcohol use. Caffeine use  Carbonated beverages, Coffee, Tea. Illicit drug use  Prefer to discuss with provider. Tobacco use  Never smoker.  Family History (Patricia Davis, CMA; 10/22/2020 11:02 AM) Alcohol Abuse  Family Members In General. Anesthetic complications  Family Members In General, Mother. Depression   Father. Hypertension  Mother. Thyroid problems  Mother.  Pregnancy / Birth History (Patricia Davis, CMA; 10/22/2020 11:02 AM) Age at menarche  14 years. Gravida  0 Para  0 Regular periods   Other Problems (Patricia Davis, CMA; 10/22/2020 11:02 AM) Anxiety Disorder  Cholelithiasis  Depression  Gastroesophageal Reflux Disease  General anesthesia - complications  High blood pressure  Hypercholesterolemia  Migraine Headache  Other disease, cancer, significant illness     Review of Systems (Patricia Davis CMA; 10/22/2020 11:02 AM) General Present- Fatigue, Fever, Night Sweats and Weight Loss. Not Present- Appetite Loss, Chills and Weight Gain. Skin Not Present- Change in Wart/Mole, Dryness, Hives, Jaundice, New Lesions, Non-Healing Wounds, Rash and Ulcer. HEENT Present- Ringing in the Ears, Seasonal Allergies, Sore Throat and Wears glasses/contact lenses. Not Present- Earache, Hearing Loss, Hoarseness, Nose Bleed, Oral Ulcers, Sinus Pain, Visual Disturbances and Yellow Eyes. Respiratory Not Present- Bloody sputum, Chronic Cough, Difficulty Breathing, Snoring and Wheezing. Breast Not Present- Breast Mass, Breast Pain, Nipple Discharge and Skin Changes. Cardiovascular Present- Chest Pain, Rapid Heart Rate and Shortness of Breath. Not Present- Difficulty Breathing Lying Down, Leg Cramps, Palpitations and Swelling of Extremities. Gastrointestinal Present- Abdominal Pain, Bloating, Excessive gas, Gets full quickly at meals, Indigestion and Nausea. Not Present- Bloody Stool, Change in Bowel Habits, Chronic diarrhea, Constipation, Difficulty Swallowing, Hemorrhoids, Rectal Pain and Vomiting. Female Genitourinary Present- Pelvic Pain. Not Present- Frequency, Nocturia, Painful Urination and Urgency. Musculoskeletal Present- Back Pain, Joint Stiffness and Muscle Pain. Not Present- Joint Pain, Muscle Weakness and Swelling of Extremities. Neurological Present- Decreased Memory and Headaches.  Not Present- Fainting, Numbness, Seizures, Tingling, Tremor, Trouble walking and Weakness. Psychiatric Present- Anxiety, Depression, Fearful and Frequent crying. Not Present-   Bipolar and Change in Sleep Pattern. Endocrine Present- Hair Changes. Not Present- Cold Intolerance, Excessive Hunger, Heat Intolerance, Hot flashes and New Diabetes. Hematology Not Present- Blood Thinners, Easy Bruising, Excessive bleeding, Gland problems, HIV and Persistent Infections.  Vitals (Patricia Davis CMA; 10/22/2020 11:04 AM) 10/22/2020 11:03 AM Weight: 206.2 lb Temp.: 32F  Pulse: 110 (Regular)  BP: 128/82(Sitting, Left Arm, Standard)       Physical Exam (Patricia Davis L. Patricia Busman MD; 10/22/2020 2:07 PM) The physical exam findings are as follows: Note: Constitutional: No acute distress; conversant; no deformities Neuro: alert and oriented; cranial nerves grossly in tact; no focal deficits Eyes: Moist conjunctiva; anicteric sclerae; extraocular movements in tact Neck: Trachea midline Lungs: Normal respiratory effort; lungs clear to auscultation bilaterally; symmetric chest wall expansion CV: Regular rate and rhythm; no murmurs; no pitting edema GI: Abdomen soft, nondistended, focally tender to palpation in RUQ; no masses or organomegaly MSK: Normal gait and station; no clubbing/cyanosis Psychiatric: Appropriate affect; alert and oriented 3    Assessment & Plan (Patricia Davis L. Patricia Busman MD; 10/22/2020 2:09 PM) SYMPTOMATIC CHOLELITHIASIS (K80.20) Story: 25 yo female presenting with RUQ abdominal pain, exacerbated by eating. I reviewed her referral notes and her RUQ [place], which shows a large stone in the neck of her gallbladder. Her pain is now quite persistent and she reports intermittent fevers at home, and is tender on exam today. Given the severity of her symptoms and reported fevers, I am scheduling her urgently for cholecystectomy this week. The details of the procedure were discussed, including the benefits and  risks of bleeding, infection, and <1% risk of common bile duct injury. All questions were answered and she agrees to proceed with surgery. Augmentin prescribed for possible cholecystitis.  Patricia Simas, MD [facility] Surgery General, Hepatobiliary and Pancreatic Surgery 10/22/20 2:10 PM

## 2020-10-22 NOTE — H&P (View-Only) (Signed)
History of Present Illness (Oluwadara Gorman L. Freida Busman MD; 10/22/2020 2:06 PM) The patient is a 25 year old female who presents for evaluation of gall stones.HPI: Patricia Davis is a 25 yo female who was referred with symptomatic cholelithiasis. For the last few weeks she has been having right flank pain, worse after eating. She reports subjective fevers. The pain has been getting worse and now occurs daily. She endorses persistent pain and nausea for the last few days. She was seen in the ED on 3/9, at which time WBC was 13 (however it has been 13-14 for several years) and LFTs and lipase were normal. A RUQ US showed a large gallstone but no pericholecystic fluid. She was discharged from the ED and referred for outpatient follow up. She has not had any previous abdominal surgeries.  PMH: pseudotumor cerebri, PCOS, depression  PSH: wisdom tooth extraction  FHx: Mother and grandmother had cholecystectomy in 38s for gallbladder disease.  Social: Occasional EtOH use, former smoker (quit 2020)    Past Surgical History Micheal Likens, CMA; 10/22/2020 11:02 AM) Oral Surgery   Diagnostic Studies History (Chanel Lonni Fix, CMA; 10/22/2020 11:02 AM) Colonoscopy  1-5 years ago Mammogram  never Pap Smear  never  Allergies (Chanel Lonni Fix, CMA; 10/22/2020 11:05 AM) Bactrim *ANTI-INFECTIVE AGENTS - MISC.*  Zoloft *ANTIDEPRESSANTS*   Medication History (Chanel Lonni Fix, CMA; 10/22/2020 11:05 AM) Burr Medico (150-35MCG/24HR Patch Weekly, Transdermal) Active. Effexor (25MG  Tablet, Oral) Active. Medications Reconciled  Social History , CMA; 10/22/2020 11:02 AM) Alcohol use  Moderate alcohol use. Caffeine use  Carbonated beverages, Coffee, Tea. Illicit drug use  Prefer to discuss with provider. Tobacco use  Never smoker.  Family History (Chanel 10/24/2020, CMA; 10/22/2020 11:02 AM) Alcohol Abuse  Family Members In General. Anesthetic complications  Family Members In General, Mother. Depression   Father. Hypertension  Mother. Thyroid problems  Mother.  Pregnancy / Birth History 10/24/2020, CMA; 10/22/2020 11:02 AM) Age at menarche  14 years. Gravida  0 Para  0 Regular periods   Other Problems (Chanel 10/24/2020, CMA; 10/22/2020 11:02 AM) Anxiety Disorder  Cholelithiasis  Depression  Gastroesophageal Reflux Disease  General anesthesia - complications  High blood pressure  Hypercholesterolemia  Migraine Headache  Other disease, cancer, significant illness     Review of Systems (Chanel Nolan CMA; 10/22/2020 11:02 AM) General Present- Fatigue, Fever, Night Sweats and Weight Loss. Not Present- Appetite Loss, Chills and Weight Gain. Skin Not Present- Change in Wart/Mole, Dryness, Hives, Jaundice, New Lesions, Non-Healing Wounds, Rash and Ulcer. HEENT Present- Ringing in the Ears, Seasonal Allergies, Sore Throat and Wears glasses/contact lenses. Not Present- Earache, Hearing Loss, Hoarseness, Nose Bleed, Oral Ulcers, Sinus Pain, Visual Disturbances and Yellow Eyes. Respiratory Not Present- Bloody sputum, Chronic Cough, Difficulty Breathing, Snoring and Wheezing. Breast Not Present- Breast Mass, Breast Pain, Nipple Discharge and Skin Changes. Cardiovascular Present- Chest Pain, Rapid Heart Rate and Shortness of Breath. Not Present- Difficulty Breathing Lying Down, Leg Cramps, Palpitations and Swelling of Extremities. Gastrointestinal Present- Abdominal Pain, Bloating, Excessive gas, Gets full quickly at meals, Indigestion and Nausea. Not Present- Bloody Stool, Change in Bowel Habits, Chronic diarrhea, Constipation, Difficulty Swallowing, Hemorrhoids, Rectal Pain and Vomiting. Female Genitourinary Present- Pelvic Pain. Not Present- Frequency, Nocturia, Painful Urination and Urgency. Musculoskeletal Present- Back Pain, Joint Stiffness and Muscle Pain. Not Present- Joint Pain, Muscle Weakness and Swelling of Extremities. Neurological Present- Decreased Memory and Headaches.  Not Present- Fainting, Numbness, Seizures, Tingling, Tremor, Trouble walking and Weakness. Psychiatric Present- Anxiety, Depression, Fearful and Frequent crying. Not Present-  Bipolar and Change in Sleep Pattern. Endocrine Present- Hair Changes. Not Present- Cold Intolerance, Excessive Hunger, Heat Intolerance, Hot flashes and New Diabetes. Hematology Not Present- Blood Thinners, Easy Bruising, Excessive bleeding, Gland problems, HIV and Persistent Infections.  Vitals (Chanel Nolan CMA; 10/22/2020 11:04 AM) 10/22/2020 11:03 AM Weight: 206.2 lb Temp.: 32F  Pulse: 110 (Regular)  BP: 128/82(Sitting, Left Arm, Standard)       Physical Exam (Rawan Riendeau L. Freida Busman MD; 10/22/2020 2:07 PM) The physical exam findings are as follows: Note: Constitutional: No acute distress; conversant; no deformities Neuro: alert and oriented; cranial nerves grossly in tact; no focal deficits Eyes: Moist conjunctiva; anicteric sclerae; extraocular movements in tact Neck: Trachea midline Lungs: Normal respiratory effort; lungs clear to auscultation bilaterally; symmetric chest wall expansion CV: Regular rate and rhythm; no murmurs; no pitting edema GI: Abdomen soft, nondistended, focally tender to palpation in RUQ; no masses or organomegaly MSK: Normal gait and station; no clubbing/cyanosis Psychiatric: Appropriate affect; alert and oriented 3    Assessment & Plan (Henley Boettner L. Freida Busman MD; 10/22/2020 2:09 PM) SYMPTOMATIC CHOLELITHIASIS (K80.20) Story: 25 yo female presenting with RUQ abdominal pain, exacerbated by eating. I reviewed her referral notes and her RUQ [place], which shows a large stone in the neck of her gallbladder. Her pain is now quite persistent and she reports intermittent fevers at home, and is tender on exam today. Given the severity of her symptoms and reported fevers, I am scheduling her urgently for cholecystectomy this week. The details of the procedure were discussed, including the benefits and  risks of bleeding, infection, and <1% risk of common bile duct injury. All questions were answered and she agrees to proceed with surgery. Augmentin prescribed for possible cholecystitis.  Sophronia Simas, MD [facility] General, Hepatobiliary and Pancreatic Surgery 10/22/20 2:10 PM

## 2020-10-23 ENCOUNTER — Encounter (HOSPITAL_COMMUNITY): Payer: Self-pay | Admitting: Surgery

## 2020-10-23 ENCOUNTER — Other Ambulatory Visit: Payer: Self-pay

## 2020-10-24 ENCOUNTER — Ambulatory Visit (HOSPITAL_COMMUNITY)
Admission: RE | Admit: 2020-10-24 | Discharge: 2020-10-24 | Disposition: A | Discharge status: Home / Self Care | Payer: BC Managed Care – PPO | Attending: Surgery | Admitting: Surgery

## 2020-10-24 ENCOUNTER — Ambulatory Visit (HOSPITAL_COMMUNITY): Payer: BC Managed Care – PPO

## 2020-10-24 ENCOUNTER — Encounter (HOSPITAL_COMMUNITY): Payer: Self-pay | Admitting: Surgery

## 2020-10-24 ENCOUNTER — Encounter (HOSPITAL_COMMUNITY)
Admission: RE | Disposition: A | Discharge status: Home / Self Care | Payer: Self-pay | Source: Home / Self Care | Attending: Surgery

## 2020-10-24 DIAGNOSIS — Z87891 Personal history of nicotine dependence: Secondary | ICD-10-CM | POA: Insufficient documentation

## 2020-10-24 DIAGNOSIS — Z8249 Family history of ischemic heart disease and other diseases of the circulatory system: Secondary | ICD-10-CM | POA: Diagnosis not present

## 2020-10-24 DIAGNOSIS — K8010 Calculus of gallbladder with chronic cholecystitis without obstruction: Secondary | ICD-10-CM | POA: Diagnosis not present

## 2020-10-24 DIAGNOSIS — Z20822 Contact with and (suspected) exposure to covid-19: Secondary | ICD-10-CM | POA: Insufficient documentation

## 2020-10-24 DIAGNOSIS — K828 Other specified diseases of gallbladder: Secondary | ICD-10-CM | POA: Diagnosis not present

## 2020-10-24 DIAGNOSIS — E785 Hyperlipidemia, unspecified: Secondary | ICD-10-CM | POA: Diagnosis not present

## 2020-10-24 DIAGNOSIS — Z419 Encounter for procedure for purposes other than remedying health state, unspecified: Secondary | ICD-10-CM

## 2020-10-24 DIAGNOSIS — Z888 Allergy status to other drugs, medicaments and biological substances status: Secondary | ICD-10-CM | POA: Diagnosis not present

## 2020-10-24 DIAGNOSIS — I10 Essential (primary) hypertension: Secondary | ICD-10-CM | POA: Insufficient documentation

## 2020-10-24 DIAGNOSIS — Z48815 Encounter for surgical aftercare following surgery on the digestive system: Secondary | ICD-10-CM | POA: Diagnosis not present

## 2020-10-24 DIAGNOSIS — Z8379 Family history of other diseases of the digestive system: Secondary | ICD-10-CM | POA: Insufficient documentation

## 2020-10-24 DIAGNOSIS — K8020 Calculus of gallbladder without cholecystitis without obstruction: Secondary | ICD-10-CM | POA: Diagnosis not present

## 2020-10-24 DIAGNOSIS — K219 Gastro-esophageal reflux disease without esophagitis: Secondary | ICD-10-CM | POA: Insufficient documentation

## 2020-10-24 DIAGNOSIS — E559 Vitamin D deficiency, unspecified: Secondary | ICD-10-CM | POA: Diagnosis not present

## 2020-10-24 DIAGNOSIS — Z881 Allergy status to other antibiotic agents status: Secondary | ICD-10-CM | POA: Insufficient documentation

## 2020-10-24 HISTORY — PX: CHOLECYSTECTOMY: SHX55

## 2020-10-24 HISTORY — DX: Nausea with vomiting, unspecified: R11.2

## 2020-10-24 HISTORY — DX: Panic disorder (episodic paroxysmal anxiety): F41.0

## 2020-10-24 HISTORY — DX: Calculus of gallbladder without cholecystitis without obstruction: K80.20

## 2020-10-24 HISTORY — DX: Headache, unspecified: R51.9

## 2020-10-24 HISTORY — DX: Irritable bowel syndrome, unspecified: K58.9

## 2020-10-24 LAB — PREGNANCY, URINE: Preg Test, Ur: NEGATIVE

## 2020-10-24 SURGERY — LAPAROSCOPIC CHOLECYSTECTOMY
Anesthesia: General | Site: Abdomen

## 2020-10-24 MED ORDER — LIDOCAINE 2% (20 MG/ML) 5 ML SYRINGE
INTRAMUSCULAR | Status: DC | PRN
  Administered 2020-10-24: 75 mg via INTRAVENOUS

## 2020-10-24 MED ORDER — MIDAZOLAM HCL 2 MG/2ML IJ SOLN
INTRAMUSCULAR | Status: AC
  Filled 2020-10-24: qty 2

## 2020-10-24 MED ORDER — LACTATED RINGERS IV SOLN: INTRAVENOUS | Status: DC

## 2020-10-24 MED ORDER — BUPIVACAINE-EPINEPHRINE (PF) 0.25% -1:200000 IJ SOLN
INTRAMUSCULAR | Status: AC
  Filled 2020-10-24: qty 30

## 2020-10-24 MED ORDER — FENTANYL CITRATE (PF) 100 MCG/2ML IJ SOLN
INTRAMUSCULAR | Status: DC | PRN
  Administered 2020-10-24: 50 ug via INTRAVENOUS
  Administered 2020-10-24: 100 ug via INTRAVENOUS
  Administered 2020-10-24: 50 ug via INTRAVENOUS
  Administered 2020-10-24 (×2): 100 ug via INTRAVENOUS
  Administered 2020-10-24: 50 ug via INTRAVENOUS

## 2020-10-24 MED ORDER — CHLORHEXIDINE GLUCONATE 0.12 % MT SOLN
15.0000 mL | Freq: Once | OROMUCOSAL | Status: AC
  Administered 2020-10-24: 15 mL via OROMUCOSAL

## 2020-10-24 MED ORDER — DEXAMETHASONE SODIUM PHOSPHATE 10 MG/ML IJ SOLN
INTRAMUSCULAR | Status: AC
  Filled 2020-10-24: qty 1

## 2020-10-24 MED ORDER — AMISULPRIDE (ANTIEMETIC) 5 MG/2ML IV SOLN
INTRAVENOUS | Status: AC
  Filled 2020-10-24: qty 2

## 2020-10-24 MED ORDER — LIDOCAINE HCL (PF) 2 % IJ SOLN
INTRAMUSCULAR | Status: DC | PRN
  Administered 2020-10-24: 1 mg/kg/h via INTRADERMAL
  Administered 2020-10-24: 1.5 mg/kg/h via INTRADERMAL

## 2020-10-24 MED ORDER — PROPOFOL 10 MG/ML IV BOLUS
INTRAVENOUS | Status: AC
  Filled 2020-10-24: qty 20

## 2020-10-24 MED ORDER — FENTANYL CITRATE (PF) 250 MCG/5ML IJ SOLN
INTRAMUSCULAR | Status: AC
  Filled 2020-10-24: qty 5

## 2020-10-24 MED ORDER — AMISULPRIDE (ANTIEMETIC) 5 MG/2ML IV SOLN
INTRAVENOUS | Status: DC | PRN
  Administered 2020-10-24: 10 mg via INTRAVENOUS

## 2020-10-24 MED ORDER — FENTANYL CITRATE (PF) 100 MCG/2ML IJ SOLN
25.0000 ug | INTRAMUSCULAR | Status: DC | PRN
  Administered 2020-10-24: 50 ug via INTRAVENOUS
  Administered 2020-10-24 (×2): 25 ug via INTRAVENOUS

## 2020-10-24 MED ORDER — IOHEXOL 300 MG/ML  SOLN
INTRAMUSCULAR | Status: DC | PRN
  Administered 2020-10-24: 4 mL

## 2020-10-24 MED ORDER — LACTATED RINGERS IR SOLN
Status: DC | PRN
  Administered 2020-10-24: 1000 mL

## 2020-10-24 MED ORDER — OXYCODONE HCL 5 MG PO TABS
5.0000 mg | ORAL_TABLET | Freq: Once | ORAL | Status: AC | PRN
  Administered 2020-10-24: 5 mg via ORAL

## 2020-10-24 MED ORDER — ROCURONIUM BROMIDE 10 MG/ML (PF) SYRINGE
PREFILLED_SYRINGE | INTRAVENOUS | Status: AC
  Filled 2020-10-24: qty 10

## 2020-10-24 MED ORDER — ORAL CARE MOUTH RINSE: 15.0000 mL | Freq: Once | OROMUCOSAL | Status: AC

## 2020-10-24 MED ORDER — BUPIVACAINE-EPINEPHRINE 0.25% -1:200000 IJ SOLN
INTRAMUSCULAR | Status: DC | PRN
  Administered 2020-10-24: 30 mL

## 2020-10-24 MED ORDER — LABETALOL HCL 5 MG/ML IV SOLN
INTRAVENOUS | Status: DC | PRN
  Administered 2020-10-24: 5 mg via INTRAVENOUS
  Administered 2020-10-24: 2.5 mg via INTRAVENOUS
  Administered 2020-10-24: 5 mg via INTRAVENOUS
  Administered 2020-10-24 (×2): 2.5 mg via INTRAVENOUS

## 2020-10-24 MED ORDER — CEFAZOLIN SODIUM-DEXTROSE 2-4 GM/100ML-% IV SOLN
2.0000 g | INTRAVENOUS | Status: AC
  Administered 2020-10-24: 2 g via INTRAVENOUS
  Filled 2020-10-24: qty 100

## 2020-10-24 MED ORDER — MIDAZOLAM HCL 5 MG/5ML IJ SOLN
INTRAMUSCULAR | Status: DC | PRN
  Administered 2020-10-24 (×2): 2 mg via INTRAVENOUS

## 2020-10-24 MED ORDER — DEXAMETHASONE SODIUM PHOSPHATE 10 MG/ML IJ SOLN
INTRAMUSCULAR | Status: DC | PRN
  Administered 2020-10-24: 7 mg via INTRAVENOUS

## 2020-10-24 MED ORDER — ONDANSETRON HCL 4 MG/2ML IJ SOLN
4.0000 mg | Freq: Four times a day (QID) | INTRAMUSCULAR | Status: DC | PRN
Start: 2020-10-24 — End: 2020-10-24

## 2020-10-24 MED ORDER — GABAPENTIN 300 MG PO CAPS
300.0000 mg | ORAL_CAPSULE | ORAL | Status: AC
  Administered 2020-10-24: 300 mg via ORAL
  Filled 2020-10-24: qty 1

## 2020-10-24 MED ORDER — OXYCODONE HCL 5 MG PO TABS
5.0000 mg | ORAL_TABLET | Freq: Once | ORAL | Status: AC
  Administered 2020-10-24: 5 mg via ORAL

## 2020-10-24 MED ORDER — LIDOCAINE HCL 2 % IJ SOLN
INTRAMUSCULAR | Status: AC
  Filled 2020-10-24: qty 20

## 2020-10-24 MED ORDER — PROPOFOL 10 MG/ML IV BOLUS
INTRAVENOUS | Status: DC | PRN
  Administered 2020-10-24: 200 mg via INTRAVENOUS

## 2020-10-24 MED ORDER — OXYCODONE HCL 5 MG/5ML PO SOLN: 5.0000 mg | Freq: Once | ORAL | Status: AC | PRN

## 2020-10-24 MED ORDER — SCOPOLAMINE 1 MG/3DAYS TD PT72
1.0000 | MEDICATED_PATCH | TRANSDERMAL | Status: DC
  Administered 2020-10-24: 1.5 mg via TRANSDERMAL
  Filled 2020-10-24: qty 1

## 2020-10-24 MED ORDER — SCOPOLAMINE 1 MG/3DAYS TD PT72
MEDICATED_PATCH | TRANSDERMAL | Status: AC
  Filled 2020-10-24: qty 1

## 2020-10-24 MED ORDER — LIDOCAINE 2% (20 MG/ML) 5 ML SYRINGE
INTRAMUSCULAR | Status: AC
  Filled 2020-10-24: qty 5

## 2020-10-24 MED ORDER — PROPOFOL 500 MG/50ML IV EMUL
INTRAVENOUS | Status: DC | PRN
  Administered 2020-10-24: 180 ug/kg/min via INTRAVENOUS

## 2020-10-24 MED ORDER — 0.9 % SODIUM CHLORIDE (POUR BTL) OPTIME
TOPICAL | Status: DC | PRN
  Administered 2020-10-24: 1000 mL

## 2020-10-24 MED ORDER — ONDANSETRON HCL 4 MG/2ML IJ SOLN
INTRAMUSCULAR | Status: AC
  Filled 2020-10-24: qty 2

## 2020-10-24 MED ORDER — PROPOFOL 500 MG/50ML IV EMUL: INTRAVENOUS | Status: DC | PRN

## 2020-10-24 MED ORDER — OXYCODONE HCL 5 MG PO TABS
ORAL_TABLET | ORAL | Status: AC
  Filled 2020-10-24: qty 1

## 2020-10-24 MED ORDER — ACETAMINOPHEN 500 MG PO TABS
1000.0000 mg | ORAL_TABLET | ORAL | Status: AC
  Administered 2020-10-24: 1000 mg via ORAL
  Filled 2020-10-24: qty 2

## 2020-10-24 MED ORDER — FENTANYL CITRATE (PF) 100 MCG/2ML IJ SOLN
INTRAMUSCULAR | Status: AC
  Filled 2020-10-24: qty 2

## 2020-10-24 MED ORDER — HYDROCODONE-ACETAMINOPHEN 5-325 MG PO TABS: 1.0000 | ORAL_TABLET | Freq: Four times a day (QID) | ORAL | 0 refills | Status: DC | PRN

## 2020-10-24 MED ORDER — ONDANSETRON HCL 4 MG/2ML IJ SOLN
INTRAMUSCULAR | Status: DC | PRN
  Administered 2020-10-24 (×2): 4 mg via INTRAVENOUS

## 2020-10-24 MED ORDER — ROCURONIUM BROMIDE 10 MG/ML (PF) SYRINGE
PREFILLED_SYRINGE | INTRAVENOUS | Status: DC | PRN
  Administered 2020-10-24: 10 mg via INTRAVENOUS
  Administered 2020-10-24 (×2): 20 mg via INTRAVENOUS
  Administered 2020-10-24: 50 mg via INTRAVENOUS

## 2020-10-24 MED ORDER — SUGAMMADEX SODIUM 500 MG/5ML IV SOLN
INTRAVENOUS | Status: DC | PRN
  Administered 2020-10-24: 350 mg via INTRAVENOUS

## 2020-10-24 MED ORDER — SUCCINYLCHOLINE CHLORIDE 20 MG/ML IJ SOLN
INTRAMUSCULAR | Status: DC | PRN
  Administered 2020-10-24: 120 mg via INTRAVENOUS

## 2020-10-24 SURGICAL SUPPLY — 58 items
ADH SKN CLS APL DERMABOND .7 (GAUZE/BANDAGES/DRESSINGS) ×1
APL PRP STRL LF DISP 70% ISPRP (MISCELLANEOUS) ×1
APL SWBSTK 6 STRL LF DISP (MISCELLANEOUS)
APPLICATOR COTTON TIP 6 STRL (MISCELLANEOUS) IMPLANT
APPLICATOR COTTON TIP 6IN STRL (MISCELLANEOUS)
APPLIER CLIP 5 13 M/L LIGAMAX5 (MISCELLANEOUS) ×2
APR CLP MED LRG 5 ANG JAW (MISCELLANEOUS) ×1
BAG SPEC RTRVL LRG 6X4 10 (ENDOMECHANICALS) ×1
BLADE SURG 15 STRL LF DISP TIS (BLADE) ×1 IMPLANT
BLADE SURG 15 STRL SS (BLADE) ×2
BLADE SURG SZ11 CARB STEEL (BLADE) ×2 IMPLANT
CHLORAPREP W/TINT 26 (MISCELLANEOUS) ×2 IMPLANT
CLIP APPLIE 5 13 M/L LIGAMAX5 (MISCELLANEOUS) ×1 IMPLANT
COVER MAYO STAND STRL (DRAPES) IMPLANT
COVER SURGICAL LIGHT HANDLE (MISCELLANEOUS) ×2 IMPLANT
COVER WAND RF STERILE (DRAPES) IMPLANT
DECANTER SPIKE VIAL GLASS SM (MISCELLANEOUS) ×2 IMPLANT
DERMABOND ADVANCED (GAUZE/BANDAGES/DRESSINGS) ×1
DERMABOND ADVANCED .7 DNX12 (GAUZE/BANDAGES/DRESSINGS) ×1 IMPLANT
DRAPE 3/4 80X56 (DRAPES) ×1 IMPLANT
DRAPE C-ARM 42X120 X-RAY (DRAPES) ×1 IMPLANT
DRAPE UTILITY XL STRL (DRAPES) ×2 IMPLANT
ELECT PENCIL ROCKER SW 15FT (MISCELLANEOUS) ×2 IMPLANT
ELECT REM PT RETURN 15FT ADLT (MISCELLANEOUS) ×2 IMPLANT
ENDOLOOP SUT PDS II  0 18 (SUTURE) ×2
ENDOLOOP SUT PDS II 0 18 (SUTURE) IMPLANT
GAUZE 4X4 16PLY RFD (DISPOSABLE) ×2 IMPLANT
GLOVE SURG POLYISO LF SZ5.5 (GLOVE) ×2 IMPLANT
GLOVE SURG UNDER POLY LF SZ6 (GLOVE) ×2 IMPLANT
GOWN STRL REUS W/TWL LRG LVL3 (GOWN DISPOSABLE) ×2 IMPLANT
GOWN STRL REUS W/TWL XL LVL3 (GOWN DISPOSABLE) ×4 IMPLANT
GRASPER SUT TROCAR 14GX15 (MISCELLANEOUS) IMPLANT
HEMOSTAT SNOW SURGICEL 2X4 (HEMOSTASIS) IMPLANT
KIT BASIN OR (CUSTOM PROCEDURE TRAY) ×2 IMPLANT
KIT TURNOVER KIT A (KITS) ×2 IMPLANT
L-HOOK LAP DISP 36CM (ELECTROSURGICAL) ×2
LHOOK LAP DISP 36CM (ELECTROSURGICAL) ×1 IMPLANT
NDL INSUFFLATION 14GA 120MM (NEEDLE) IMPLANT
NEEDLE HYPO 22GX1.5 SAFETY (NEEDLE) ×2 IMPLANT
NEEDLE INSUFFLATION 14GA 120MM (NEEDLE) IMPLANT
PACK UNIVERSAL I (CUSTOM PROCEDURE TRAY) ×2 IMPLANT
POUCH SPECIMEN RETRIEVAL 10MM (ENDOMECHANICALS) ×2 IMPLANT
SCISSORS LAP 5X35 DISP (ENDOMECHANICALS) ×2 IMPLANT
SET CHOLANGIOGRAPH MIX (MISCELLANEOUS) ×1 IMPLANT
SET IRRIG TUBING LAPAROSCOPIC (IRRIGATION / IRRIGATOR) ×2 IMPLANT
SET TUBE SMOKE EVAC HIGH FLOW (TUBING) ×2 IMPLANT
SLEEVE XCEL OPT CAN 5 100 (ENDOMECHANICALS) ×4 IMPLANT
SOL ANTI FOG 6CC (MISCELLANEOUS) ×1 IMPLANT
SOLUTION ANTI FOG 6CC (MISCELLANEOUS) ×1
SUT MNCRL AB 4-0 PS2 18 (SUTURE) ×2 IMPLANT
SUT VICRYL 0 UR6 27IN ABS (SUTURE) ×2 IMPLANT
SYR 20ML LL LF (SYRINGE) IMPLANT
SYR CONTROL 10ML LL (SYRINGE) ×2 IMPLANT
TOWEL OR 17X26 10 PK STRL BLUE (TOWEL DISPOSABLE) ×2 IMPLANT
TOWEL OR NON WOVEN STRL DISP B (DISPOSABLE) IMPLANT
TROCAR BLADELESS OPT 5 100 (ENDOMECHANICALS) ×2 IMPLANT
TROCAR XCEL 12X100 BLDLESS (ENDOMECHANICALS) IMPLANT
TROCAR XCEL BLUNT TIP 100MML (ENDOMECHANICALS) ×1 IMPLANT

## 2020-10-24 NOTE — Anesthesia Preprocedure Evaluation (Signed)
Anesthesia Evaluation  Patient identified by MRN, date of birth, ID band Patient awake    Reviewed: Allergy & Precautions, H&P , NPO status , Patient's Chart, lab work & pertinent test results  Airway Mallampati: II   Neck ROM: full    Dental   Pulmonary former smoker,    breath sounds clear to auscultation       Cardiovascular negative cardio ROS   Rhythm:regular Rate:Normal     Neuro/Psych  Headaches, PSYCHIATRIC DISORDERS Anxiety Depression    GI/Hepatic GERD  ,cholelithiasis   Endo/Other    Renal/GU      Musculoskeletal   Abdominal   Peds  Hematology   Anesthesia Other Findings   Reproductive/Obstetrics                             Anesthesia Physical Anesthesia Plan  ASA: II  Anesthesia Plan: General   Post-op Pain Management:    Induction: Intravenous  PONV Risk Score and Plan: 3 and Ondansetron, Dexamethasone, Midazolam and Treatment may vary due to age or medical condition  Airway Management Planned: Oral ETT  Additional Equipment:   Intra-op Plan:   Post-operative Plan: Extubation in OR  Informed Consent: I have reviewed the patients History and Physical, chart, labs and discussed the procedure including the risks, benefits and alternatives for the proposed anesthesia with the patient or authorized representative who has indicated his/her understanding and acceptance.     Dental advisory given  Plan Discussed with: CRNA, Anesthesiologist and Surgeon  Anesthesia Plan Comments:         Anesthesia Quick Evaluation

## 2020-10-24 NOTE — Op Note (Signed)
Date: 10/24/20  Patient: Patricia Davis MRN: [id number]  Preoperative Diagnosis: Symptomatic cholelithiasis Postoperative Diagnosis: Same  Procedure: Laparoscopic cholecystectomy with intraoperative cholangiogram  Surgeon: Sophronia Simas, MD  EBL: 50 mL  Anesthesia: General  Specimens: Gallbladder  Indications: Ms. Totton is a 25 yo female who presented with persistent RUQ pain occurring almost daily, worse with meals. RUQ US showed cholelithiasis. After a discussion of the risks and benefits of surgery, she agreed to proceed with cholecystectomy.  Findings: Cholelithiasis. Gallbladder adhesions and mild wall thickening consistent with chronic cholelithiasis. Normal intraoperative cholangiogram.  Procedure details: Informed consent was obtained in the preoperative area prior to the procedure. The patient was brought to the operating room and placed on the table in the supine position. General anesthesia was induced and appropriate lines and drains were placed for intraoperative monitoring. Perioperative antibiotics were administered per SCIP guidelines. The abdomen was prepped and draped in the usual sterile fashion. A pre-procedure timeout was taken verifying patient identity, surgical site and procedure to be performed.  A small infraumbilical skin incision was made, the subcutaneous tissue was divided with cautery, and the umbilical stalk was grasped and elevated. The fascia was incised and the peritoneal cavity was directly visualized. A 79mm Hassan trocar was placed. The peritoneal cavity was inspected with no evidence of visceral or vascular injury. Three 44mm ports were placed in the right subcostal margin, all under direct visualization. The fundus of the gallbladder was grasped and retracted cephalad. There were omental adhesions to the gallbladder, consistent with chronic cholecystitis. The gallbladder was very long and folded over on itself and the infundibulum was mildly  adherent to the duodenum. This made it difficult initially to find the cystic triangle, so I elected to perform a cholangiogram. Dissection was started high up on the gallbladder, working inferiorly. As I dissected further down, the infundibulum was easily able to separated from the duodenum by dividing thin filmy adhesions. This made the cystic triangle structures more readily visible. The cystic triangle was dissected out using cautery and blunt dissection, and the critical view of safety was obtained. The cystic duct was partially transected and a cholangiocatheter was passed through the abdominal wall through a separate stab incision. The catheter was passed into the cystic duct and secured with a clip. A cholangiogram was performed under fluoroscopy and contrast was visualized filling the remainder of the cystic duct, the entire common bile duct, and the common hepatic duct and left and right hepatic ducts. Contrast readily filled the duodenum. The cholangiocatheter was removed and the cystic duct was completely transected. The cystic duct was mildly dilated and so the cystic duct stump was closed with a PDS endoloop. The cystic artery was clipped and divided.  The gallbladder was taken off the liver using cautery. The specimen was placed in an endocatch bag and removed. The surgical site was irrigated with saline until the effluent was clear. Hemostasis was achieved in the gallbladder fossa using cautery. The cystic duct and artery stumps were visually inspected and there was no evidence of bile leak or bleeding. The ports were removed under direct visualization and the abdomen was desufflated. The umbilical port site fascia was closed with a 0 vicryl suture. The skin at all port sites was closed with 4-0 monocryl subcuticular suture. Dermabond was applied.  The patient tolerated the procedure well with no apparent complications. All counts were correct x2 at the end of the procedure. The patient was  extubated and taken to PACU in stable condition.  [place]  Freida Busman, MD 10/24/20 12:58 PM

## 2020-10-24 NOTE — Discharge Instructions (Signed)
[facility] DISCHARGE INSTRUCTIONS  Activity . No heavy lifting greater than 10 pounds for 4 weeks after surgery. [place] to shower, but do not bathe or submerge incisions underwater. . Do not drive while taking narcotic pain medication.  Wound Care . Your incisiona are covered with skin glue called Dermabond. This will peel off on its own over time. . You may shower and allow warm soapy water to run over your incisions. Gently pat dry. . Do not submerge your incision underwater. . Monitor your incision for any new redness, tenderness, or drainage.  When to Call us: Marland Kitchen Fever greater than 100.5 . New redness, drainage, or swelling at incision site . Severe pain, nausea, or vomiting . Jaundice (yellowing of the whites of the eyes or skin)  Follow-up You have an appointment scheduled with Dr. Freida Busman on December 03, 2020 at 10:30am. This will be at the [facility] office at [address], [address], [place], [place]. Please arrive at least 15 minutes prior to your scheduled appointment time.  For questions or concerns, please call the office at [phone].

## 2020-10-24 NOTE — Interval H&P Note (Signed)
History and Physical Interval Note:  10/24/2020 10:36 AM  Patricia Davis  has presented today for surgery, with the diagnosis of CHOLELITHIASIS.  The various methods of treatment have been discussed with the patient and family. After consideration of risks, benefits and other options for treatment, the patient has consented to  Procedure(s): LAPAROSCOPIC CHOLECYSTECTOMY (N/A) as a surgical intervention.  The patient's history has been reviewed, patient examined, no change in status, stable for surgery.  I have reviewed the patient's chart and labs.  Questions were answered to the patient's satisfaction.     Fritzi Mandes

## 2020-10-24 NOTE — Anesthesia Procedure Notes (Signed)
Procedure Name: Intubation Date/Time: 10/24/2020 11:11 AM Performed by: Lissa Morales, CRNA Pre-anesthesia Checklist: Patient identified, Emergency Drugs available, Suction available and Patient being monitored Oxygen Delivery Method: Circle system utilized Preoxygenation: Pre-oxygenation with 100% oxygen Induction Type: IV induction Laryngoscope Size: Mac and 4 Grade View: Grade I Tube type: Oral Tube size: 7.0 mm Number of attempts: 1 Airway Equipment and Method: Stylet Placement Confirmation: ETT inserted through vocal cords under direct vision,  positive ETCO2 and breath sounds checked- equal and bilateral Secured at: 21 cm Tube secured with: Tape Dental Injury: Teeth and Oropharynx as per pre-operative assessment

## 2020-10-24 NOTE — Transfer of Care (Signed)
Immediate Anesthesia Transfer of Care Note  Patient: Patricia Davis  Procedure(s) Performed: LAPAROSCOPIC CHOLECYSTECTOMY WITH INTRAOPERATIVE CHOLANGIOGRAM (N/A Abdomen)  Patient Location: PACU  Anesthesia Type:General  Level of Consciousness: awake, alert , oriented and patient cooperative  Airway & Oxygen Therapy: Patient Spontanous Breathing and Patient connected to face mask oxygen  Post-op Assessment: Report given to RN, Post -op Vital signs reviewed and stable and Patient moving all extremities X 4  Post vital signs: stable  Last Vitals:  Vitals Value Taken Time  BP 153/91 10/24/20 1300  Temp    Pulse 79 10/24/20 1310  Resp 18 10/24/20 1310  SpO2 100 % 10/24/20 1310  Vitals shown include unvalidated device data.  Last Pain:  Vitals:   10/24/20 1300  TempSrc:   PainSc: Asleep      Patients Stated Pain Goal: 2 (10/24/20 0940)  Complications: No complications documented.

## 2020-10-26 ENCOUNTER — Encounter (HOSPITAL_COMMUNITY): Payer: Self-pay | Admitting: Surgery

## 2020-10-26 NOTE — Anesthesia Postprocedure Evaluation (Signed)
Anesthesia Post Note  Patient: Patricia Davis  Procedure(s) Performed: LAPAROSCOPIC CHOLECYSTECTOMY WITH INTRAOPERATIVE CHOLANGIOGRAM (N/A Abdomen)     Patient location during evaluation: PACU Anesthesia Type: General Level of consciousness: awake and alert Pain management: pain level controlled Vital Signs Assessment: post-procedure vital signs reviewed and stable Respiratory status: spontaneous breathing, nonlabored ventilation, respiratory function stable and patient connected to nasal cannula oxygen Cardiovascular status: blood pressure returned to baseline and stable Postop Assessment: no apparent nausea or vomiting Anesthetic complications: no   No complications documented.  Last Vitals:  Vitals:   10/24/20 1500 10/24/20 1600  BP: (!) 145/90 (!) 146/93  Pulse: 81 77  Resp: 17   Temp:    SpO2: 99% 99%    Last Pain:  Vitals:   10/24/20 1615  TempSrc:   PainSc: 3                  HODIERNE,ADAM S

## 2020-10-27 LAB — SURGICAL PATHOLOGY

## 2020-11-10 ENCOUNTER — Ambulatory Visit: Payer: 59 | Admitting: Adult Health Nurse Practitioner

## 2020-11-10 ENCOUNTER — Other Ambulatory Visit: Payer: Self-pay

## 2020-11-10 ENCOUNTER — Ambulatory Visit (HOSPITAL_COMMUNITY)
Admission: EM | Admit: 2020-11-10 | Discharge: 2020-11-10 | Disposition: A | Discharge status: Home / Self Care | Payer: BC Managed Care – PPO | Attending: Student | Admitting: Student

## 2020-11-10 ENCOUNTER — Encounter (HOSPITAL_COMMUNITY): Payer: Self-pay

## 2020-11-10 DIAGNOSIS — Z9049 Acquired absence of other specified parts of digestive tract: Secondary | ICD-10-CM | POA: Diagnosis not present

## 2020-11-10 DIAGNOSIS — R197 Diarrhea, unspecified: Secondary | ICD-10-CM | POA: Diagnosis not present

## 2020-11-10 DIAGNOSIS — J020 Streptococcal pharyngitis: Secondary | ICD-10-CM | POA: Diagnosis not present

## 2020-11-10 DIAGNOSIS — Z112 Encounter for screening for other bacterial diseases: Secondary | ICD-10-CM | POA: Diagnosis not present

## 2020-11-10 LAB — POCT RAPID STREP A, ED / UC: Streptococcus, Group A Screen (Direct): NEGATIVE

## 2020-11-10 MED ORDER — AMOXICILLIN-POT CLAVULANATE 875-125 MG PO TABS: 1.0000 | ORAL_TABLET | Freq: Two times a day (BID) | ORAL | 0 refills | Status: AC

## 2020-11-10 NOTE — ED Triage Notes (Signed)
Pt in with c/o tonsillar inflammation and white spots on her tongue and tonsils  Pt has been taking Augmentin that she had left over from a surgery

## 2020-11-10 NOTE — Discharge Instructions (Addendum)
-  I sent a 5 more days of Augmentin.  Take 2 pills a day for 5 days.  You can take this with food if you have a sensitive stomach, this may help with your diarrhea. -You can also try a probiotic to help with your diarrhea, these can be purchased over-the-counter. -You can also take Tylenol and ibuprofen for symptomatic relief. -You should not be contagious for strep anymore as you have been on antibiotics for 24 hours. -Seek additional immediate medical attention if you experience new symptoms like abdominal pain, shortness of breath, trouble swallowing, etc.

## 2020-11-10 NOTE — ED Provider Notes (Signed)
[facility]    CSN: [id number] Arrival date & time: 11/10/20  1017      History   Chief Complaint Chief Complaint  Patient presents with  . Sore Throat    HPI Patricia Davis is a 25 y.o. female presenting with 3 days of sore throat and white patches on tonsils.  History of strep in the past.  Also with history of cholecystitis, cholecystectomy 17 days ago.  States she has taken Augmentin that she had on hand, has taken 2 pills a day for the last 2 days but is out of this and so is here to urgent care today.  Endorses some nasal congestion and right ear pressure, which she attributes to allergic rhinitis- currently poorly controlled on no medications.  Also with few episodes of diarrhea since starting the antibiotic, but denies abdominal pain. Denies fevers/chills, n/v, shortness of breath, chest pain, cough, congestion, facial pain, teeth pain, headaches, sore throat, loss of taste/smell, swollen lymph nodes, dizziness, hearing changes.   HPI  Past Medical History:  Diagnosis Date  . Anxiety   . Gallstones   . Headache   . IBS (irritable bowel syndrome)   . Major depressive disorder 2014  . Nausea & vomiting   . Panic attack   . PCOS (polycystic ovarian syndrome)   . Pseudotumor cerebri 2010    Patient Active Problem List   Diagnosis Date Noted  . Vitamin D deficiency 11/16/2017  . Obesity (BMI 30.0-34.9) 11/16/2017  . Hyperlipidemia 11/16/2017  . Elevated BP without diagnosis of hypertension 11/16/2017  . GERD (gastroesophageal reflux disease) 01/21/2016  . Episodic paroxysmal anxiety disorder 12/27/2012  . Migraine without aura and responsive to treatment 10/18/2011    Past Surgical History:  Procedure Laterality Date  . CHOLECYSTECTOMY N/A 10/24/2020   Procedure: LAPAROSCOPIC CHOLECYSTECTOMY WITH INTRAOPERATIVE CHOLANGIOGRAM;  Surgeon: Fritzi Mandes, MD;  Location: [facility];  Service: General;  Laterality: N/A;  . COLONOSCOPY    . UPPER GI ENDOSCOPY     . WISDOM TOOTH EXTRACTION  05/2014    OB History   No obstetric history on file.      Home Medications    Prior to Admission medications   Medication Sig Start Date End Date Taking? Authorizing Provider  amoxicillin-clavulanate (AUGMENTIN) 875-125 MG tablet Take 1 tablet by mouth every 12 (twelve) hours for 5 days. 11/10/20 11/15/20 Yes Rhys Martini, PA-C  Melatonin 10 MG CAPS Take by mouth.   Yes [provider]  busPIRone (BUSPAR) 5 MG tablet Take       1 tablet      2 x /day       for  Chronic Anxiety Patient not taking: No sig reported 05/30/20   Lucky Cowboy, MD  ciprofloxacin-dexamethasone (CIPRODEX) OTIC suspension Place 4 drops into the right ear daily.    [provider]  docusate sodium (COLACE) 100 MG capsule Take 100 mg by mouth 2 (two) times daily.    [provider]  HYDROcodone-acetaminophen (NORCO/VICODIN) 5-325 MG tablet Take 1 tablet by mouth every 6 (six) hours as needed for severe pain. 10/24/20   Fritzi Mandes, MD  norelgestromin-ethinyl estradiol [employer]) 150-35 MCG/24HR transdermal patch Place 1 patch onto the skin once a week. Patient not taking: No sig reported 08/28/20   Carlus Pavlov, MD  ondansetron (ZOFRAN ODT) 8 MG disintegrating tablet Take 0.5 tablets (4 mg total) by mouth every 8 (eight) hours as needed for nausea or vomiting. 10/20/20   Elder Negus, NP  oxyCODONE-acetaminophen (PERCOCET) 5-325 MG tablet Take 1-2 tablets by mouth every 4-6 hours as needed for SEVERE pain. Patient taking differently: Take 2 tablets by mouth in the morning and at bedtime. 10/20/20   Elder Negus, NP  venlafaxine XR (EFFEXOR-XR) 75 MG 24 hr capsule Take 3 capsules by mouth daily with breakfast. Patient taking differently: Take 225 mg by mouth daily with breakfast. 10/20/20   Elder Negus, NP  Burr Medico 150-35 MCG/24HR transdermal patch Apply one patch topically once a week. Patient taking differently: Place 1 patch onto the skin once  a week. 03/01/20   Judd Gaudier, NP    Family History Family History  Problem Relation Age of Onset  . Depression Mother   . Hypothyroidism Mother   . Gallbladder disease Mother   . Hypertension Mother   . Depression Father   . Gallbladder disease Father   . Hypertension Father   . Pancreatic cancer Maternal Grandfather   . Colon cancer Maternal Grandfather   . Diverticulitis Maternal Great-grandmother   . Diverticulitis Maternal Aunt   . Diabetes Paternal Grandmother   . Hypertension Paternal Grandmother     Social History Social History   Tobacco Use  . Smoking status: Former Smoker    Types: Cigarettes    Quit date: 2020    Years since quitting: 2.2  . Smokeless tobacco: Never Used  Vaping Use  . Vaping Use: Some days  . Start date: 08/09/2012  Substance Use Topics  . Alcohol use: Yes    Alcohol/week: 0.0 standard drinks    Comment: 1-4 drinks 0-3 nights a week  . Drug use: Yes    Types: Marijuana    Comment: 3-4 times per week     Allergies   Sertraline and Bactrim [sulfamethoxazole-trimethoprim]   Review of Systems Review of Systems  Constitutional: Negative for appetite change, chills and fever.  HENT: Positive for sore throat. Negative for congestion, ear pain, rhinorrhea, sinus pressure, sinus pain, tinnitus, trouble swallowing and voice change.   Eyes: Negative for redness and visual disturbance.  Respiratory: Negative for cough, chest tightness, shortness of breath and wheezing.   Cardiovascular: Negative for chest pain and palpitations.  Gastrointestinal: Positive for diarrhea. Negative for abdominal pain, constipation, nausea and vomiting.  Genitourinary: Negative for dysuria, frequency and urgency.  Musculoskeletal: Negative for myalgias.  Skin:       Surgical scars abdomen  Neurological: Negative for dizziness, weakness and headaches.  Psychiatric/Behavioral: Negative for confusion.  All other systems reviewed and are  negative.    Physical Exam Triage Vital Signs ED Triage Vitals  Enc Vitals Group     BP 11/10/20 1054 (!) 141/107     Pulse Rate 11/10/20 1054 (!) 120     Resp 11/10/20 1054 18     Temp 11/10/20 1054 99.3 F (37.4 C)     Temp src --      SpO2 11/10/20 1054 95 %     Weight --      Height --      Head Circumference --      Peak Flow --      Pain Score 11/10/20 1051 2     Pain Loc --      Pain Edu? --      Excl. in GC? --    No data found.  Updated Vital Signs BP (!) 141/107   Pulse (!) 120   Temp 99.3 F (37.4 C)   Resp 18   LMP 11/05/2020 (Approximate)   SpO2  95%   Visual Acuity Right Eye Distance:   Left Eye Distance:   Bilateral Distance:    Right Eye Near:   Left Eye Near:    Bilateral Near:     Physical Exam Vitals reviewed.  Constitutional:      General: She is not in acute distress.    Appearance: Normal appearance. She is not ill-appearing.  HENT:     Head: Normocephalic and atraumatic.     Right Ear: Hearing, tympanic membrane, ear canal and external ear normal. No swelling or tenderness. There is no impacted cerumen. No mastoid tenderness. Tympanic membrane is not perforated, erythematous, retracted or bulging.     Left Ear: Hearing, tympanic membrane, ear canal and external ear normal. No swelling or tenderness. There is no impacted cerumen. No mastoid tenderness. Tympanic membrane is not perforated, erythematous, retracted or bulging.     Nose:     Right Sinus: No maxillary sinus tenderness or frontal sinus tenderness.     Left Sinus: No maxillary sinus tenderness or frontal sinus tenderness.     Mouth/Throat:     Mouth: Mucous membranes are moist.     Pharynx: Uvula midline. No oropharyngeal exudate or posterior oropharyngeal erythema.     Tonsils: Tonsillar exudate present. 2+ on the right. 2+ on the left.  Cardiovascular:     Rate and Rhythm: Regular rhythm. Tachycardia present.     Heart sounds: Normal heart sounds.  Pulmonary:     Breath  sounds: Normal breath sounds and air entry. No wheezing, rhonchi or rales.  Chest:     Chest wall: No tenderness.  Abdominal:     General: Abdomen is flat. Bowel sounds are normal.     Tenderness: There is no abdominal tenderness. There is no right CVA tenderness, left CVA tenderness, guarding or rebound. Negative signs include Murphy's sign, Rovsing's sign and McBurney's sign.  Lymphadenopathy:     Cervical: No cervical adenopathy.  Skin:    Comments: Laparoscopic surgical scars abdominal wall, healing well.  Neurological:     General: No focal deficit present.     Mental Status: She is alert and oriented to person, place, and time.  Psychiatric:        Attention and Perception: Attention and perception normal.        Mood and Affect: Mood and affect normal.        Behavior: Behavior normal. Behavior is cooperative.        Thought Content: Thought content normal.        Judgment: Judgment normal.      UC Treatments / Results  Labs (all labs ordered are listed, but only abnormal results are displayed) Labs Reviewed  CULTURE, GROUP A STREP [facility]  POCT RAPID STREP A, ED / UC    EKG   Radiology No results found.  Procedures Procedures (including critical care time)  Medications Ordered in UC Medications - No data to display  Initial Impression / Assessment and Plan / UC Course  I have reviewed the triage vital signs and the nursing notes.  Pertinent labs & imaging results that were available during my care of the patient were reviewed by me and considered in my medical decision making (see chart for details).     This patient is a 25 year old female presenting with step pharyngitis. Today this pt is tachycardic but afebrile nontachypneic, oxygenating well on room air, no wheezes rhonchi or rales.  This patient has already been taking Augmentin that she had at home.  Rapid strep negative; but this patient has been taking augmentin for several days already, suspect  false negative. Centor score 3. Plan to send 5 more days of augmentin, for a total of 7 days of treatment. Culture sent.  For diarrhea, suspect this is related to recent cholecystectomy or Augmentin.  Recommended probiotic.    Strict ED precautions discussed.  This chart was dictated using voice recognition software, Dragon. Despite the best efforts of this provider to proofread and correct errors, errors may still occur which can change documentation meaning.   Final Clinical Impressions(s) / UC Diagnoses   Final diagnoses:  Screening for streptococcal infection  Diarrhea, unspecified type  History of cholecystectomy  Acute streptococcal pharyngitis     Discharge Instructions     -I sent a 5 more days of Augmentin.  Take 2 pills a day for 5 days.  You can take this with food if you have a sensitive stomach, this may help with your diarrhea. -You can also try a probiotic to help with your diarrhea, these can be purchased over-the-counter. -You can also take Tylenol and ibuprofen for symptomatic relief. -You should not be contagious for strep anymore as you have been on antibiotics for 24 hours. -Seek additional immediate medical attention if you experience new symptoms like abdominal pain, shortness of breath, trouble swallowing, etc.    ED Prescriptions    Medication Sig Dispense Auth. Provider   amoxicillin-clavulanate (AUGMENTIN) 875-125 MG tablet Take 1 tablet by mouth every 12 (twelve) hours for 5 days. 10 tablet Rhys Martini, PA-C     PDMP not reviewed this encounter.   Rhys Martini, PA-C 11/10/20 1159

## 2020-11-12 LAB — CULTURE, GROUP A STREP (THRC)

## 2020-12-18 ENCOUNTER — Encounter: Payer: 59 | Admitting: Physician Assistant

## 2020-12-23 ENCOUNTER — Encounter: Payer: Self-pay | Admitting: Internal Medicine

## 2021-01-08 DIAGNOSIS — E282 Polycystic ovarian syndrome: Secondary | ICD-10-CM | POA: Insufficient documentation

## 2021-02-02 ENCOUNTER — Encounter: Payer: 59 | Admitting: Physician Assistant

## 2021-02-12 ENCOUNTER — Encounter: Payer: 59 | Admitting: Adult Health Nurse Practitioner

## 2021-02-25 ENCOUNTER — Ambulatory Visit: Payer: BC Managed Care – PPO | Admitting: Internal Medicine

## 2021-03-09 DIAGNOSIS — Z01419 Encounter for gynecological examination (general) (routine) without abnormal findings: Secondary | ICD-10-CM | POA: Diagnosis not present

## 2021-03-09 DIAGNOSIS — N760 Acute vaginitis: Secondary | ICD-10-CM | POA: Diagnosis not present

## 2021-03-09 DIAGNOSIS — Z6830 Body mass index (BMI) 30.0-30.9, adult: Secondary | ICD-10-CM | POA: Diagnosis not present

## 2021-03-09 DIAGNOSIS — Z113 Encounter for screening for infections with a predominantly sexual mode of transmission: Secondary | ICD-10-CM | POA: Diagnosis not present

## 2021-03-27 ENCOUNTER — Other Ambulatory Visit: Payer: Self-pay | Admitting: Internal Medicine

## 2021-03-27 MED ORDER — DEXAMETHASONE 4 MG PO TABS: ORAL_TABLET | ORAL | 0 refills | Status: DC

## 2021-03-27 MED ORDER — BENZONATATE 200 MG PO CAPS: ORAL_CAPSULE | ORAL | 1 refills | Status: DC

## 2021-04-27 ENCOUNTER — Other Ambulatory Visit: Payer: Self-pay | Admitting: Adult Health Nurse Practitioner

## 2021-05-11 ENCOUNTER — Ambulatory Visit: Payer: BC Managed Care – PPO | Admitting: Internal Medicine

## 2021-05-11 NOTE — Progress Notes (Deleted)
Patient ID: Patricia Davis, female   DOB: 1996/04/17, 25 y.o.   MRN: [id number]   This visit occurred during the SARS-CoV-2 public health emergency.  Safety protocols were in place, including screening questions prior to the visit, additional usage of staff PPE, and extensive cleaning of exam room while observing appropriate contact time as indicated for disinfecting solutions.   HPI: Patricia Davis is a 25 y.o. female, initially referred by Dr. Oneta Rack, returning for follow-up for PCOS.  Last visit 8.5 months ago. She previously saw OB/GYN in [place], but not recently.  Interim history: She returns for labs since last visit and they were normal, but clinical picture was consistent with possible mild PCOS.  Fertility/Menstrual cycles: - menarche at 25 y/o - regular menses after menarche until 25 years old when she started [place] contraceptive patch.  She was off at last visit - she stopped the patch in 03/2020 >> amenorrhea for 3 months - LMP 2.5 weeks ago - children: 0 - miscarriages: 0  During investigation for amenorrhea, she had a transvaginal ultrasound: - TV U/S (06/18/2020): Numerous follicles in both ovaries -seen in patients with PCOS  She had lower abdominal pain especially in the right pelvic area.  This is constant, and not necessarily related to her menstrual cycles  Acne: - back of buttock, upper legs developed after stopping Xulane >> now back on it - not on back or face  Hirsutism: - upper lip - occasionally has to shave - thicker hair on the rest of her body after stopping Xulane >> now back on it  Weight gain: -She lost 45 pounds in 2020-2021, gained 10 lbs before our visit in 08/2020 - no steroid use - no weight loss meds - Meals: - Breakfast: Water, sometimes with lemon - Lunch: Sandwich or salad - Dinner: Chicken with mashed potatoes + broccoli, burgers - Snacks: Cereal, popcorn, chocolate, 2 to 3/day - Diets tried: Tried to follow a low FODMAP  elimination diet in 2018-2019 >> stopped onions afterwards - Exercise: stretching, pull ups, lift weights, hikes rarely, walks. Also, active at work.  Treatments tried: - did not try Metformin - did not try Spironolactone - did not try Vaniqa - restarted Xulane 08/2020  On Biotin 2500 mcg daily.  Reviewed previous investigation (off biotin): Component     Latest Ref Rng & Units 09/10/2020  Testosterone, Serum (Total)     ng/dL 59 (H)  % Free Testosterone     % 0.4  Free Testosterone, S     pg/mL 2.4  Sex Hormone Binding Globulin     nmol/L 134.1 (H)  Estradiol, Free     0.4-5.55 pg/mL 0.92  Estradiol     pg/mL 60  17-OH-Progesterone, LC/MS/MS     * ng/dL 43  Prolactin     ng/mL 8.3  LH     mIU/mL 5.27  FSH     mIU/ML 7.0  DHEA-Sulfate, LCMS     ug/dL 761   All labs are normal:  LH: FSH ratio is not elevated.  Free testosterone level is normal.   Free estradiol level is normal. SHBG is elevated, which is excellent.  Prolactin is normal, ruling out hyperprolactinemia.  17 hydroxyprogesterone is not elevated, ruling out CAH.    -Reviewed latest thyroid tests: Lab Results  Component Value Date   TSH 1.66 02/13/2020   -Reviewed latest set of lipids:    Component Value Date/Time   CHOL 249 (H) 02/13/2020 1612   TRIG 181 (H) 02/13/2020 1612  HDL 66 02/13/2020 1612   CHOLHDL 3.8 02/13/2020 1612   VLDL 30 01/21/2016 1429   LDLCALC 151 (H) 02/13/2020 1612  She has fatty liver on liver ultrasound.  She also has a history of an elevated lipase in 06/2020.  -Reviewed latest HbA1c: Lab Results  Component Value Date   HGBA1C 5.0 02/13/2020   She has FH of PCOS.   She has a history of pseudotumor cerebri, major depressive disorder, anxiety.   She also has a low vitamin D: Lab Results  Component Value Date   VD25OH 23 (L) 02/13/2020   VD25OH 19 (L) 11/15/2018   VD25OH 27 (L) 06/07/2018   VD25OH 36 03/23/2017   VD25OH 30 01/21/2016  She is taking a vitamin D  supplement  ROS: + See HPI. She also has fatigue, heartburn, muscle and joint aches, headaches  Past Medical History:  Diagnosis Date   Anxiety    Gallstones    Headache    IBS (irritable bowel syndrome)    Major depressive disorder 2014   Nausea & vomiting    Panic attack    PCOS (polycystic ovarian syndrome)    Pseudotumor cerebri 2010   Past Surgical History:  Procedure Laterality Date   CHOLECYSTECTOMY N/A 10/24/2020   Procedure: LAPAROSCOPIC CHOLECYSTECTOMY WITH INTRAOPERATIVE CHOLANGIOGRAM;  Surgeon: Fritzi Mandes, MD;  Location: [facility];  Service: General;  Laterality: N/A;   COLONOSCOPY     UPPER GI ENDOSCOPY     WISDOM TOOTH EXTRACTION  05/2014   Social History   Socioeconomic History   Marital status: Single    Spouse name: Not on file   Number of children: 0   Years of education: 12   Highest education level: GED or equivalent  Occupational History   Occupation: International aid/development worker at [employer]  Tobacco Use   Smoking status: Former    Types: Cigarettes    Quit date: 2020    Years since quitting: 2.7   Smokeless tobacco: Never  Vaping Use   Vaping Use: Some days   Start date: 08/09/2012  Substance and Sexual Activity   Alcohol use: Yes    Alcohol/week: 0.0 standard drinks    Comment: 1-4 drinks 0-3 nights a week   Drug use: Yes    Types: Marijuana    Comment: 3-4 times per week   Sexual activity: Yes    Partners: Male    Birth control/protection: Patch  Other Topics Concern   Not on file  Social History Narrative   In a relationship   Social Determinants of Health   Financial Resource Strain: Not on file  Food Insecurity: Not on file  Transportation Needs: Not on file  Physical Activity: Not on file  Stress: Not on file  Social Connections: Not on file  Intimate Partner Violence: Not on file   Current Outpatient Medications on File Prior to Visit  Medication Sig Dispense Refill   benzonatate (TESSALON) 200 MG capsule  Take 1 perle 3 x / day to prevent cough 30 capsule 1   dexamethasone (DECADRON) 4 MG tablet Take 1 tab 3 x /day for 2 days,      then 2 x /day for 2  Days,     then 1 tab daily 13 tablet 0   docusate sodium (COLACE) 100 MG capsule Take 100 mg by mouth 2 (two) times daily.     Melatonin 10 MG CAPS Take by mouth.     norelgestromin-ethinyl estradiol [employer]) 150-35 MCG/24HR transdermal  patch Place 1 patch onto the skin once a week. (Patient not taking: No sig reported) 12 patch 3   ondansetron (ZOFRAN ODT) 8 MG disintegrating tablet Take 0.5 tablets (4 mg total) by mouth every 8 (eight) hours as needed for nausea or vomiting. 30 tablet 1   venlafaxine XR (EFFEXOR-XR) 75 MG 24 hr capsule Take 3 capsules by mouth daily with breakfast. 270 capsule 1   No current facility-administered medications on file prior to visit.   Allergies  Allergen Reactions   Sertraline Swelling    Laryngeal Edema   Bactrim [Sulfamethoxazole-Trimethoprim] Nausea Only    yeast   Family History  Problem Relation Age of Onset   Depression Mother    Hypothyroidism Mother    Gallbladder disease Mother    Hypertension Mother    Depression Father    Gallbladder disease Father    Hypertension Father    Pancreatic cancer Maternal Grandfather    Colon cancer Maternal Grandfather    Diverticulitis Maternal Great-grandmother    Diverticulitis Maternal Aunt    Diabetes Paternal Grandmother    Hypertension Paternal Grandmother     PE: There were no vitals taken for this visit. Wt Readings from Last 3 Encounters:  10/24/20 201 lb (91.2 kg)  10/15/20 204 lb (92.5 kg)  08/28/20 209 lb 3.2 oz (94.9 kg)   Constitutional: overweight, in NAD, no full supraclavicular fat pads Eyes: PERRLA, EOMI, no exophthalmos ENT: moist mucous membranes, no thyromegaly, no cervical lymphadenopathy Cardiovascular: RRR, No MRG Respiratory: CTA B Gastrointestinal: abdomen soft, NT, ND, BS+ Musculoskeletal: no deformities, strength intact  in all 4 Skin: moist, warm; few acne spots face (chin), no dark terminal hair on chin, no vellum on sideburns, no skin tags, no acanthosis nigricans, no purple, wide, stretch marks Neurological: no tremor with outstretched hands, DTR normal in all 4  ASSESSMENT: 1. PCOS  PLAN: 1.  Pt with history of 3 months of amenorrhea after stopping contraceptive patch before her last visit 8 months ago.  During investigation for amenorrhea, she was sent for a transvaginal ultrasound which shows multiple, small, follicular cysts, reminiscent of PCOS.  At last visit, she did not have biochemical investigation for this.  Also, she did not have significant hirsutism and only had minimal acne.  This started after stopping her Xulane patch.  She mentioned that she stopped the patch as she was in a relationship and was contemplating a pregnancy but at last visit, she did not desire pregnancy anymore. -At last visit, we discussed about etiology and manifestations of the PCOS syndrome.  I explained that this is caused by a dysfunction in the pituitary gonadal pulse generator in which LH and FSH hormones are secreted in pulses with an abnormal frequency.  As a consequence, patients may have an excess of estrogen and testosterone.  This abnormality can lead to weight gain, insulin resistance, acne, hirsutism, irregular menstrual cycles, and decreased fertility. -At last visit, I advised her to return for lab work after she stopped biotin as she was taking 2500 mcg daily and I explained that this dose can influence her monitor results.  At that time, we checked her LH, FSH, testosterone, estradiol, 17 hydroxyprogesterone, prolactin, and DHEA-S and they were all normal.  However, in the clinical context and the presence of multiple cysts on ovarian ultrasound, we made the diagnosis of mild PCOS. -At that time I advised referral to nutrition and also to include more cardio into her exercise routine, and she was mostly doing  weight lifting.  We also discussed about to restart insulin, which she is started since last visit.  I also recommended to establish care with OB/GYN. -Since last visit, she did notice an improvement in acne and hirsutism. -Since then, she was also able to lose 8 pounds in the next 2 months   2.  Vitamin D insufficiency -No new or worsening joint or muscle pains -She is on a vitamin D supplement -We will recheck her vitamin D level  Carlus Pavlov, MD PhD [facility] Endocrinology

## 2021-05-29 DIAGNOSIS — J209 Acute bronchitis, unspecified: Secondary | ICD-10-CM | POA: Diagnosis not present

## 2021-05-29 DIAGNOSIS — R519 Headache, unspecified: Secondary | ICD-10-CM | POA: Diagnosis not present

## 2021-05-29 DIAGNOSIS — J09X9 Influenza due to identified novel influenza A virus with other manifestations: Secondary | ICD-10-CM | POA: Diagnosis not present

## 2021-05-29 DIAGNOSIS — R059 Cough, unspecified: Secondary | ICD-10-CM | POA: Diagnosis not present

## 2021-06-01 ENCOUNTER — Other Ambulatory Visit: Payer: Self-pay | Admitting: Internal Medicine

## 2021-06-01 DIAGNOSIS — R112 Nausea with vomiting, unspecified: Secondary | ICD-10-CM

## 2021-06-01 MED ORDER — ONDANSETRON 8 MG PO TBDP: ORAL_TABLET | ORAL | 1 refills | Status: DC

## 2021-09-22 DIAGNOSIS — E282 Polycystic ovarian syndrome: Secondary | ICD-10-CM | POA: Diagnosis not present

## 2022-04-26 ENCOUNTER — Encounter (HOSPITAL_BASED_OUTPATIENT_CLINIC_OR_DEPARTMENT_OTHER): Payer: Self-pay | Admitting: Emergency Medicine

## 2022-04-26 ENCOUNTER — Emergency Department (HOSPITAL_BASED_OUTPATIENT_CLINIC_OR_DEPARTMENT_OTHER)
Admission: EM | Admit: 2022-04-26 | Discharge: 2022-04-26 | Disposition: A | Discharge status: Home / Self Care | Payer: 59 | Attending: Emergency Medicine | Admitting: Emergency Medicine

## 2022-04-26 ENCOUNTER — Emergency Department (HOSPITAL_BASED_OUTPATIENT_CLINIC_OR_DEPARTMENT_OTHER): Payer: 59

## 2022-04-26 ENCOUNTER — Emergency Department (HOSPITAL_BASED_OUTPATIENT_CLINIC_OR_DEPARTMENT_OTHER): Payer: 59 | Admitting: Radiology

## 2022-04-26 ENCOUNTER — Other Ambulatory Visit: Payer: Self-pay

## 2022-04-26 DIAGNOSIS — R109 Unspecified abdominal pain: Secondary | ICD-10-CM | POA: Diagnosis not present

## 2022-04-26 DIAGNOSIS — R55 Syncope and collapse: Secondary | ICD-10-CM | POA: Diagnosis not present

## 2022-04-26 DIAGNOSIS — R42 Dizziness and giddiness: Secondary | ICD-10-CM | POA: Diagnosis not present

## 2022-04-26 DIAGNOSIS — R1031 Right lower quadrant pain: Secondary | ICD-10-CM | POA: Insufficient documentation

## 2022-04-26 DIAGNOSIS — R1032 Left lower quadrant pain: Secondary | ICD-10-CM | POA: Insufficient documentation

## 2022-04-26 DIAGNOSIS — R519 Headache, unspecified: Secondary | ICD-10-CM | POA: Diagnosis not present

## 2022-04-26 DIAGNOSIS — R079 Chest pain, unspecified: Secondary | ICD-10-CM | POA: Diagnosis not present

## 2022-04-26 DIAGNOSIS — Z20822 Contact with and (suspected) exposure to covid-19: Secondary | ICD-10-CM | POA: Diagnosis not present

## 2022-04-26 LAB — CBC WITH DIFFERENTIAL/PLATELET
Abs Immature Granulocytes: 0.03 10*3/uL (ref 0.00–0.07)
Basophils Absolute: 0 10*3/uL (ref 0.0–0.1)
Basophils Relative: 0 %
Eosinophils Absolute: 0.3 10*3/uL (ref 0.0–0.5)
Eosinophils Relative: 3 %
HCT: 43.8 % (ref 36.0–46.0)
Hemoglobin: 15.2 g/dL — ABNORMAL HIGH (ref 12.0–15.0)
Immature Granulocytes: 0 %
Lymphocytes Relative: 25 %
Lymphs Abs: 3.1 10*3/uL (ref 0.7–4.0)
MCH: 30.6 pg (ref 26.0–34.0)
MCHC: 34.7 g/dL (ref 30.0–36.0)
MCV: 88.3 fL (ref 80.0–100.0)
Monocytes Absolute: 0.7 10*3/uL (ref 0.1–1.0)
Monocytes Relative: 5 %
Neutro Abs: 8.4 10*3/uL — ABNORMAL HIGH (ref 1.7–7.7)
Neutrophils Relative %: 67 %
Platelets: 288 10*3/uL (ref 150–400)
RBC: 4.96 MIL/uL (ref 3.87–5.11)
RDW: 12.6 % (ref 11.5–15.5)
WBC: 12.5 10*3/uL — ABNORMAL HIGH (ref 4.0–10.5)
nRBC: 0 % (ref 0.0–0.2)

## 2022-04-26 LAB — COMPREHENSIVE METABOLIC PANEL
ALT: 14 U/L (ref 0–44)
AST: 16 U/L (ref 15–41)
Albumin: 4.8 g/dL (ref 3.5–5.0)
Alkaline Phosphatase: 80 U/L (ref 38–126)
Anion gap: 12 (ref 5–15)
BUN: 7 mg/dL (ref 6–20)
CO2: 25 mmol/L (ref 22–32)
Calcium: 9.8 mg/dL (ref 8.9–10.3)
Chloride: 101 mmol/L (ref 98–111)
Creatinine, Ser: 0.72 mg/dL (ref 0.44–1.00)
GFR, Estimated: >60 mL/min (ref >60)
Glucose, Bld: 100 mg/dL — ABNORMAL HIGH (ref 70–99)
Potassium: 3.9 mmol/L (ref 3.5–5.1)
Sodium: 138 mmol/L (ref 135–145)
Total Bilirubin: 0.3 mg/dL (ref 0.3–1.2)
Total Protein: 7.4 g/dL (ref 6.5–8.1)

## 2022-04-26 LAB — URINALYSIS, ROUTINE W REFLEX MICROSCOPIC
Bilirubin Urine: NEGATIVE
Glucose, UA: NEGATIVE mg/dL
Hgb urine dipstick: NEGATIVE
Ketones, ur: NEGATIVE mg/dL
Leukocytes,Ua: NEGATIVE
Nitrite: NEGATIVE
Protein, ur: NEGATIVE mg/dL
Specific Gravity, Urine: 1.01 (ref 1.005–1.030)
pH: 8 (ref 5.0–8.0)

## 2022-04-26 LAB — RESP PANEL BY RT-PCR (FLU A&B, COVID) ARPGX2
Influenza A by PCR: NEGATIVE
Influenza B by PCR: NEGATIVE
SARS Coronavirus 2 by RT PCR: NEGATIVE

## 2022-04-26 LAB — CBG MONITORING, ED: Glucose-Capillary: 110 mg/dL — ABNORMAL HIGH (ref 70–99)

## 2022-04-26 LAB — LIPASE, BLOOD: Lipase: 17 U/L (ref 11–51)

## 2022-04-26 LAB — HCG, QUANTITATIVE, PREGNANCY: hCG, Beta Chain, Quant, S: <1 m[IU]/mL (ref <5)

## 2022-04-26 LAB — TROPONIN I (HIGH SENSITIVITY): Troponin I (High Sensitivity): 2 ng/L (ref <18)

## 2022-04-26 MED ORDER — ONDANSETRON HCL 4 MG/2ML IJ SOLN: 4.0000 mg | Freq: Once | INTRAMUSCULAR | Status: DC

## 2022-04-26 MED ORDER — SODIUM CHLORIDE 0.9 % IV BOLUS
500.0000 mL | Freq: Once | INTRAVENOUS | Status: AC
  Administered 2022-04-26: 500 mL via INTRAVENOUS

## 2022-04-26 MED ORDER — METOCLOPRAMIDE HCL 5 MG/ML IJ SOLN
10.0000 mg | Freq: Once | INTRAMUSCULAR | Status: AC
  Administered 2022-04-26: 10 mg via INTRAVENOUS
  Filled 2022-04-26: qty 2

## 2022-04-26 MED ORDER — LACTATED RINGERS IV BOLUS
1000.0000 mL | Freq: Once | INTRAVENOUS | Status: AC
  Administered 2022-04-26: 1000 mL via INTRAVENOUS

## 2022-04-26 NOTE — ED Notes (Signed)
Patient transported to CT 

## 2022-04-26 NOTE — ED Provider Notes (Signed)
[facility] Provider Note   CSN: [id number] Arrival date & time: 04/26/22  0907     History {Add pertinent medical, surgical, social history, OB history to HPI:1} Chief Complaint  Patient presents with  . Abdominal Pain    Patricia Davis is a 26 y.o. female.   Abdominal Pain   26 year old female presents emergency department with complaints of dizziness, headache, abdominal pain and vomiting.  Patient states that symptoms have been intermittently present for the past month.  She notes symptoms occurring when she was walking around in [place] and began to feel dizzy.  She denies episodes of syncope.  She notes feelings of dizziness with postural changes as well as with any physical activity.  She states that she has been concerned about passing out when she is been driving.  She notes history of anxiety attacks and is unaware of if degree of symptoms are related to increased anxiety once initially felt.  Sh past medical history significant for e states the only thing that helps the dizziness is if she completely sits still.  She reports history of pseudotumor cerebri when she was younger.  She currently takes oral OCPs but states that her last menstrual period was greater than 1 month ago and she is usually regular ***.  She reports chance of pregnancy.  She also complains of abdominal pain that is lower in nature.  She states that she has a history of IBS and symptoms feel similar to prior IBS flareups.  Describes headache as ***.  Denies visual disturbance, slurred speech, gait abnormalities, weakness/sensory deficits, fever, chills, night sweats, chest pain, shortness of breath, urinary/vaginal symptoms.  Denies history of DVT/PE, recent surgery within 1 year, immobilization, known malignancy.  Major depressive disorder, pseudotumor cerebri, IBS, headache, panic attack, PCOS  Home Medications Prior to Admission medications   Medication Sig Start Date End  Date Taking? Authorizing Provider  benzonatate (TESSALON) 200 MG capsule Take 1 perle 3 x / day to prevent cough 03/27/21   Unk Pinto, MD  docusate sodium (COLACE) 100 MG capsule Take 100 mg by mouth 2 (two) times daily.    [provider]  Melatonin 10 MG CAPS Take by mouth.    [provider]  norelgestromin-ethinyl estradiol Marilu Favre) 150-35 MCG/24HR transdermal patch Place 1 patch onto the skin once a week. Patient not taking: No sig reported 08/28/20   Philemon Kingdom, MD  ondansetron (ZOFRAN ODT) 8 MG disintegrating tablet Dissolve  1 tablet  under tongue 3 x /day  as needed for Nausea 06/01/21   Unk Pinto, MD  venlafaxine XR (EFFEXOR-XR) 75 MG 24 hr capsule Take 3 capsules by mouth daily with breakfast. 04/27/21   Alycia Rossetti, NP      Allergies    Sertraline and Bactrim [sulfamethoxazole-trimethoprim]    Review of Systems   Review of Systems  Gastrointestinal:  Positive for abdominal pain.  All other systems reviewed and are negative.   Physical Exam Updated Vital Signs BP (!) 138/107   Pulse 76   Temp 98.5 F (36.9 C)   Resp 17   Ht 5\' 10"  (1.778 m)   Wt 95.3 kg   LMP 04/12/2022 (Approximate)   SpO2 100%   BMI 30.13 kg/m  Physical Exam Vitals and nursing note reviewed.  Constitutional:      General: She is not in acute distress.    Appearance: She is well-developed. She is not ill-appearing.  HENT:     Head: Normocephalic and atraumatic.  Eyes:     Extraocular Movements: Extraocular movements intact.     Conjunctiva/sclera: Conjunctivae normal.     Pupils: Pupils are equal, round, and reactive to light.  Cardiovascular:     Rate and Rhythm: Normal rate and regular rhythm.     Heart sounds: No murmur heard. Pulmonary:     Effort: Pulmonary effort is normal. No respiratory distress.     Breath sounds: Normal breath sounds.  Abdominal:     Palpations: Abdomen is soft.     Tenderness: There is abdominal tenderness in the right  lower quadrant, suprapubic area and left lower quadrant.     Comments: Mild lower abdominal tenderness.  Musculoskeletal:        General: No swelling.     Cervical back: Neck supple.  Skin:    General: Skin is warm and dry.     Capillary Refill: Capillary refill takes less than 2 seconds.  Neurological:     Mental Status: She is alert.     Comments: Alert and oriented to self, place, time and event.   Speech is fluent, clear without dysarthria or dysphasia.   Strength 5/5 in upper/lower extremities  Sensation intact in upper/lower extremities   Normal gait.  Negative Romberg. No pronator drift.  Normal finger-to-nose and feet tapping.  CN I not tested  CN II grossly intact visual fields bilaterally. Did not visualize posterior eye.  CN III, IV, VI PERRLA and EOMs intact bilaterally  CN V Intact sensation to sharp and light touch to the face  CN VII slight decrease in elevation of patient's right eyebrow.  Otherwise cranial nerve VII symmetric bilaterally. CN VIII not tested  CN IX, X no uvula deviation, symmetric rise of soft palate  CN XI 5/5 SCM and trapezius strength bilaterally  CN XII Midline tongue protrusion, symmetric L/R movements   HINTS Exam - Cover-uncover: No acute abnormalities -  -    Psychiatric:        Mood and Affect: Mood normal.    ED Results / Procedures / Treatments   Labs (all labs ordered are listed, but only abnormal results are displayed) Labs Reviewed  CBG MONITORING, ED - Abnormal; Notable for the following components:      Result Value   Glucose-Capillary 110 (*)    All other components within normal limits  COMPREHENSIVE METABOLIC PANEL  CBC WITH DIFFERENTIAL/PLATELET  LIPASE, BLOOD  URINALYSIS, ROUTINE W REFLEX MICROSCOPIC  HCG, QUANTITATIVE, PREGNANCY  TROPONIN I (HIGH SENSITIVITY)    EKG None  Radiology No results found.  Procedures Procedures  {Document cardiac monitor, telemetry assessment procedure when  appropriate:1}  Medications Ordered in ED Medications  sodium chloride 0.9 % bolus 500 mL (500 mLs Intravenous New Bag/Given 04/26/22 0950)    ED Course/ Medical Decision Making/ A&P                           Medical Decision Making Amount and/or Complexity of Data Reviewed Labs: ordered. Radiology: ordered.  Risk Prescription drug management.   This patient presents to the ED for concern of ***, this involves an extensive number of treatment options, and is a complaint that carries with it a high risk of complications and morbidity.  The differential diagnosis includes ***   Co morbidities that complicate the patient evaluation  See HPI   Additional history obtained:  Additional history obtained from EMR External records from outside source obtained and reviewed including ***  Lab Tests:  I Ordered, and personally interpreted labs.  The pertinent results include:  ***   Imaging Studies ordered:  I ordered imaging studies including ***  I independently visualized and interpreted imaging which showed *** I agree with the radiologist interpretation   Cardiac Monitoring: / EKG:  The patient was maintained on a cardiac monitor.  I personally viewed and interpreted the cardiac monitored which showed an underlying rhythm of: ***   Consultations Obtained:  I requested consultation with the ***,  and discussed lab and imaging findings as well as pertinent plan - they recommend: ***   Problem List / ED Course / Critical interventions / Medication management  *** I ordered medication including ***  for ***  Reevaluation of the patient after these medicines showed that the patient {resolved/improved/worsened:23923::"improved"} I have reviewed the patients home medicines and have made adjustments as needed   Social Determinants of Health:  ***   Test / Admission - Considered:  Vitals signs significant for ***. Otherwise within normal range and stable  throughout visit. Laboratory/imaging studies significant for: *** *** Worrisome signs and symptoms were discussed with the patient, and the patient acknowledged understanding to return to the ED if noticed. Patient was stable upon discharge.    {Document critical care time when appropriate:1} {Document review of labs and clinical decision tools ie heart score, Chads2Vasc2 etc:1}  {Document your independent review of radiology images, and any outside records:1} {Document your discussion with family members, caretakers, and with consultants:1} {Document social determinants of health affecting pt's care:1} {Document your decision making why or why not admission, treatments were needed:1} Final Clinical Impression(s) / ED Diagnoses Final diagnoses:  None    Rx / DC Orders ED Discharge Orders     None

## 2022-04-26 NOTE — ED Triage Notes (Signed)
Pt from home with a history of IBS. Pt complains of headache, dizziness accompanied by abdominal pain and 2 episodes of emesis.

## 2022-04-30 ENCOUNTER — Encounter: Payer: Self-pay | Admitting: Nurse Practitioner

## 2022-04-30 ENCOUNTER — Ambulatory Visit: Payer: 59 | Admitting: Nurse Practitioner

## 2022-04-30 VITALS — BP 116/80 | HR 86 | Temp 97.5°F | Ht 70.0 in | Wt 208.4 lb

## 2022-04-30 DIAGNOSIS — R69 Illness, unspecified: Secondary | ICD-10-CM | POA: Diagnosis not present

## 2022-04-30 DIAGNOSIS — R42 Dizziness and giddiness: Secondary | ICD-10-CM | POA: Diagnosis not present

## 2022-04-30 DIAGNOSIS — R1084 Generalized abdominal pain: Secondary | ICD-10-CM

## 2022-04-30 DIAGNOSIS — F411 Generalized anxiety disorder: Secondary | ICD-10-CM

## 2022-04-30 DIAGNOSIS — G4701 Insomnia due to medical condition: Secondary | ICD-10-CM | POA: Diagnosis not present

## 2022-04-30 DIAGNOSIS — F410 Panic disorder [episodic paroxysmal anxiety] without agoraphobia: Secondary | ICD-10-CM

## 2022-04-30 MED ORDER — PROPRANOLOL HCL 10 MG PO TABS: ORAL_TABLET | ORAL | 0 refills | Status: DC

## 2022-04-30 MED ORDER — BUSPIRONE HCL 10 MG PO TABS: ORAL_TABLET | ORAL | 1 refills | Status: DC

## 2022-04-30 MED ORDER — HYDROXYZINE HCL 25 MG PO TABS: ORAL_TABLET | ORAL | 0 refills | Status: DC

## 2022-04-30 NOTE — Progress Notes (Unsigned)
Hospital follow up  Assessment and Plan: Hospital visit follow up for:   1. Generalized anxiety disorder Start Buspar as directed. Counseling services recommended. Patient provided contact number to schedule with Psych.   - busPIRone (BUSPAR) 10 MG tablet; Take 1 tab (10 mg) TID.  Dispense: 90 tablet; Refill: 1  2. Panic anxiety syndrome Take Propranolol as needed. Reviewed relaxation techniques.  Sleep hygiene. Recommended Cognitive Behavioral Therapy (CBT). Psychoeducation:  encouraged personality growth wand development through coping techniques and problem-solving skills. Limit/Decrease/Monitor drug/alcohol intake.     - propranolol (INDERAL) 10 MG tablet; Take 1 tablet daily as directed.  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  Dispense: 90 tablet; Refill: 0  4.  Generalized abdominal pain Suggest daily probiotic. Refer to GI for further review and evaluation of any underlying contributor to chronic N/V, abd pain. Continue to monitor  5.  Dizziness Refer to Neurology for consultation further evaluation and tmt of ongoing dizziness despite negative work up.    Meds ordered this encounter  Medications   busPIRone (BUSPAR) 10 MG tablet    Sig: Take 1 tab (10 mg) TID.    Dispense:  90 tablet    Refill:  1    Order Specific Question:   Supervising Provider    Answer:   Unk Pinto [[id number]]   propranolol (INDERAL) 10 MG tablet    Sig: Take 1 tablet daily as directed.    Dispense:  90 tablet    Refill:  0    Order Specific Question:   Supervising Provider    Answer:   Unk Pinto [[id number]]   hydrOXYzine (ATARAX) 25 MG tablet    Sig: Take 1 tab (25 mg) as needed at bedtime for insomnia.    Dispense:  90 tablet    Refill:  0    Order Specific  Question:   Supervising Provider    Answer:   Unk Pinto [phone]   Orders Placed This Encounter  Procedures   Ambulatory referral to Gastroenterology    Referral Priority:   Routine    Referral Type:   Consultation    Referral Reason:   Specialty Services Required    Number of Visits Requested:   1   Ambulatory referral to Neurology    Referral Priority:   Routine    Referral Type:   Consultation    Referral Reason:   Specialty Services Required    Requested Specialty:   Neurology    Number of Visits Requested:   1    All medications were reviewed with patient and family and fully reconciled. All questions answered fully, and patient and family members were encouraged to call the office with any further questions or concerns. Discussed goal to avoid readmission related to this diagnosis.   Over 40 minutes of exam, counseling, chart review, and complex, high/moderate level critical decision making was performed this visit.    Future Appointments  Date Time Provider [place]  06/02/2022 11:00 AM Darrol Jump, NP [facility] None    HPI 26 y.o.female presents with mother for follow up for transition from recent ER visit. Admit date to the hospital was 04/26/22, patient was discharged from the hospital on 04/26/22 and our clinical staff contacted the office the day after discharge to set up a  follow up appointment. The discharge summary, medications, and diagnostic test results were reviewed before meeting with the patient. The patient was admitted for:   Dizziness and giddiness along with abdominal pain. sxs of abdominal pain that have been going on for years.  She does have a hx of cholecystectomy with past colonoscopy and EGD.  Reports negative work up.  She did note that she felt faint, nausea, dizziness, head throbbing, with a fall 5 months ago (hitting head at that time and with loss of consciousness).  She reports associated vomiting.  Reports vomiting up  medications, including Effexor which she states alternates her mood and anxiety d/t not being able to get full effect of medication.  She has been feeling overwhelming stressed.  She reports having an psychologist at agee 17.  Has not been in therapy since.  She has been on the same dose of Effexor for "many years."  Reports a "rough" high school experience.  She does have hx of past abd surgery. The abdominal exam was reassuring and felt to be noncontributory to surgical etiology.  She was provided fluids and ask to f/u with PCP.  EKG Negative with NSR.  CT Head wo Contrast:  Negative  CXR:  Negative  Home health is not involved.   Images while in the hospital: CT Head Wo Contrast  Result Date: 04/26/2022 CLINICAL DATA:  Provided history: Dizziness, persistent/recurrent, cardiac or vascular cause suspected. Additional history provided: Patient reports feeling faint, nausea, dizziness, head throbbing, fall 5 months ago (hitting head at that time and with loss of consciousness). EXAM: CT HEAD WITHOUT CONTRAST TECHNIQUE: Contiguous axial images were obtained from the base of the skull through the vertex without intravenous contrast. RADIATION DOSE REDUCTION: This exam was performed according to the departmental dose-optimization program which includes automated exposure control, adjustment of the mA and/or kV according to patient size and/or use of iterative reconstruction technique. COMPARISON:  Images from MRI of the brain and orbits 09/02/2009 (report unavailable). FINDINGS: Brain: Cerebral volume is normal. There is no acute intracranial hemorrhage. No demarcated cortical infarct. No extra-axial fluid collection. No evidence of an intracranial mass. No midline shift. Vascular: No hyperdense vessel. Skull: No fracture or aggressive osseous lesion. Sinuses/Orbits: No mass or acute finding within the imaged orbits. No significant paranasal sinus disease at the imaged levels. IMPRESSION: No evidence of  acute intracranial abnormality. Electronically Signed   By: Jackey Loge D.O.   On: 04/26/2022 11:40   DG Chest 1 View  Result Date: 04/26/2022 CLINICAL DATA:  26 year old female with headache, dizziness, abdominal pain. EXAM: CHEST  1 VIEW COMPARISON:  None Available. FINDINGS: Portable AP semi upright view at 1001 hours. Lung volumes and mediastinal contours are within normal limits. Visualized tracheal air column is within normal limits. Allowing for portable technique the lungs are clear. No pneumoperitoneum identified and negative visible bowel gas. No osseous abnormality identified. IMPRESSION: Negative portable chest. Electronically Signed   By: Odessa Fleming M.D.   On: 04/26/2022 10:08     Current Outpatient Medications (Endocrine & Metabolic):    metFORMIN (GLUCOPHAGE) 500 MG tablet, Take by mouth daily with breakfast.   Norethin-Eth Estrad-Fe Biphas (LO LOESTRIN FE PO), Take by mouth.   norelgestromin-ethinyl estradiol Burr Medico) 150-35 MCG/24HR transdermal patch, Place 1 patch onto the skin once a week. (Patient not taking: Reported on 10/23/2020)  Current Outpatient Medications (Cardiovascular):    propranolol (INDERAL) 10 MG tablet, Take 1 tablet daily as directed.  Current Outpatient Medications (Respiratory):  benzonatate (TESSALON) 200 MG capsule, Take 1 perle 3 x / day to prevent cough    Current Outpatient Medications (Other):    busPIRone (BUSPAR) 10 MG tablet, Take 1 tab (10 mg) TID.   hydrOXYzine (ATARAX) 25 MG tablet, Take 1 tab (25 mg) as needed at bedtime for insomnia.   Melatonin 10 MG CAPS, Take by mouth.   ondansetron (ZOFRAN ODT) 8 MG disintegrating tablet, Dissolve  1 tablet  under tongue 3 x /day  as needed for Nausea   venlafaxine XR (EFFEXOR-XR) 75 MG 24 hr capsule, Take 3 capsules by mouth daily with breakfast.   docusate sodium (COLACE) 100 MG capsule, Take 100 mg by mouth 2 (two) times daily. (Patient not taking: Reported on 04/30/2022)  Past Medical History:   Diagnosis Date   Anxiety    Gallstones    Headache    IBS (irritable bowel syndrome)    Major depressive disorder 2014   Nausea & vomiting    Panic attack    PCOS (polycystic ovarian syndrome)    Pseudotumor cerebri 2010    Allergies:  Allergies  Allergen Reactions   Sertraline Swelling    Laryngeal Edema   Bactrim [Sulfamethoxazole-Trimethoprim] Nausea Only    yeast   Medical History:  has Migraine without aura and responsive to treatment; Episodic paroxysmal anxiety disorder; GERD (gastroesophageal reflux disease); Vitamin D deficiency; Obesity (BMI 30.0-34.9); Hyperlipidemia; and Elevated BP without diagnosis of hypertension on their problem list. Surgical History:  She  has a past surgical history that includes Wisdom tooth extraction (05/2014); Colonoscopy; Upper gi endoscopy; and Cholecystectomy (N/A, 10/24/2020). Family History:  Herfamily history includes Colon cancer in her maternal grandfather; Depression in her father and mother; Diabetes in her paternal grandmother; Diverticulitis in her maternal aunt and maternal great-grandmother; Gallbladder disease in her father and mother; Hypertension in her father, mother, and paternal grandmother; Hypothyroidism in her mother; Pancreatic cancer in her maternal grandfather. Social History:   reports that she quit smoking about 3 years ago. Her smoking use included cigarettes. She has never used smokeless tobacco. She reports current alcohol use. She reports current drug use. Drug: Marijuana.   Allergies  Allergen Reactions   Sertraline Swelling    Laryngeal Edema   Bactrim [Sulfamethoxazole-Trimethoprim] Nausea Only    yeast    ROS: all negative except above.   Physical Exam: Filed Weights   04/30/22 1008  Weight: 208 lb 6.4 oz (94.5 kg)   BP 116/80   Pulse 86   Temp (!) 97.5 F (36.4 C)   Ht 5\' 10"  (1.778 m)   Wt 208 lb 6.4 oz (94.5 kg)   LMP 04/19/2022 (Approximate)   SpO2 97%   BMI 29.90 kg/m  General  Appearance: Well nourished, in no apparent distress. Eyes: PERRLA, EOMs, conjunctiva no swelling or erythema Sinuses: No Frontal/maxillary tenderness ENT/Mouth: Ext aud canals clear, TMs without erythema, bulging. No erythema, swelling, or exudate on post pharynx.  Tonsils not swollen or erythematous. Hearing normal.  Neck: Supple, thyroid normal.  Respiratory: Respiratory effort normal, BS equal bilaterally without rales, rhonchi, wheezing or stridor.  Cardio: RRR with no MRGs. Brisk peripheral pulses without edema.  Abdomen: Soft, + BS.  Non tender, no guarding, rebound, hernias, masses. Lymphatics: Non tender without lymphadenopathy.  Musculoskeletal: Full ROM, 5/5 strength, normal gait.  Skin: Warm, dry without rashes, lesions, ecchymosis.  Neuro: Cranial nerves intact. Normal muscle tone, no cerebellar symptoms. Sensation intact.  Psych: Awake and oriented X 3, normal affect, Insight and Judgment  appropriate.     Darrol Jump, NP 8:35 AM [facility]

## 2022-04-30 NOTE — Patient Instructions (Addendum)
Dr. [facility] [email or web address] [phone] [address] [place] [address]  Managing Anxiety, Adult After being diagnosed with anxiety, you may be relieved to know why you have felt or behaved a certain way. You may also feel overwhelmed about the treatment ahead and what it will mean for your life. With care and support, you can manage this condition. How to manage lifestyle changes Managing stress and anxiety  Stress is your body's reaction to life changes and events, both good and bad. Most stress will last just a few hours, but stress can be ongoing and can lead to more than just stress. Although stress can play a major role in anxiety, it is not the same as anxiety. Stress is usually caused by something external, such as a deadline, test, or competition. Stress normally passes after the triggering event has ended.  Anxiety is caused by something internal, such as imagining a terrible outcome or worrying that something will go wrong that will devastate you. Anxiety often does not go away even after the triggering event is over, and it can become long-term (chronic) worry. It is important to understand the differences between stress and anxiety and to manage your stress effectively so that it does not lead to an anxious response. Talk with your health care provider or a counselor to learn more about reducing anxiety and stress. He or she may suggest tension reduction techniques, such as: Music therapy. Spend time creating or listening to music that you enjoy and that inspires you. Mindfulness-based meditation. Practice being aware of your normal breaths while not trying to control your breathing. It can be done while sitting or walking. Centering prayer. This involves focusing on a word, phrase, or sacred image that means something to you and brings you peace. Deep breathing. To do this, expand your stomach and inhale slowly  through your nose. Hold your breath for 3-5 seconds. Then exhale slowly, letting your stomach muscles relax. Self-talk. Learn to notice and identify thought patterns that lead to anxiety reactions and change those patterns to thoughts that feel peaceful. Muscle relaxation. Taking time to tense muscles and then relax them. Choose a tension reduction technique that fits your lifestyle and personality. These techniques take time and practice. Set aside 5-15 minutes a day to do them. Therapists can offer counseling and training in these techniques. The training to help with anxiety may be covered by some insurance plans. Other things you can do to manage stress and anxiety include: Keeping a stress diary. This can help you learn what triggers your reaction and then learn ways to manage your response. Thinking about how you react to certain situations. You may not be able to control everything, but you can control your response. Making time for activities that help you relax and not feeling guilty about spending your time in this way. Doing visual imagery. This involves imagining or creating mental pictures to help you relax. Practicing yoga. Through yoga poses, you can lower tension and promote relaxation.  Medicines Medicines can help ease symptoms. Medicines for anxiety include: Antidepressant medicines. These are usually prescribed for long-term daily control. Anti-anxiety medicines. These may be added in severe cases, especially when panic attacks occur. Medicines will be prescribed by a health care provider. When used together, medicines, psychotherapy, and tension reduction techniques may be the most effective treatment. Relationships Relationships can play a big part in helping you recover. Try to spend more time connecting with trusted friends and family members. Consider  going to couples counseling if you have a partner, taking family education classes, or going to family therapy. Therapy can  help you and others better understand your condition. How to recognize changes in your anxiety Everyone responds differently to treatment for anxiety. Recovery from anxiety happens when symptoms decrease and stop interfering with your daily activities at home or work. This may mean that you will start to: Have better concentration and focus. Worry will interfere less in your daily thinking. Sleep better. Be less irritable. Have more energy. Have improved memory. It is also important to recognize when your condition is getting worse. Contact your health care provider if your symptoms interfere with home or work and you feel like your condition is not improving. Follow these instructions at home: Activity Exercise. Adults should do the following: Exercise for at least 150 minutes each week. The exercise should increase your heart rate and make you sweat (moderate-intensity exercise). Strengthening exercises at least twice a week. Get the right amount and quality of sleep. Most adults need 7-9 hours of sleep each night. Lifestyle  Eat a healthy diet that includes plenty of vegetables, fruits, whole grains, low-fat dairy products, and lean protein. Do not eat a lot of foods that are high in fats, added sugars, or salt (sodium). Make choices that simplify your life. Do not use any products that contain nicotine or tobacco. These products include cigarettes, chewing tobacco, and vaping devices, such as e-cigarettes. If you need help quitting, ask your health care provider. Avoid caffeine, alcohol, and certain over-the-counter cold medicines. These may make you feel worse. Ask your pharmacist which medicines to avoid. General instructions Take over-the-counter and prescription medicines only as told by your health care provider. Keep all follow-up visits. This is important. Where to find support You can get help and support from these sources: Self-help groups. Online and [employer]. A trusted spiritual leader. Couples counseling. Family education classes. Family therapy. Where to find more information You may find that joining a support group helps you deal with your anxiety. The following sources can help you locate counselors or support groups near you: [place]: www.mentalhealth[place]ica.net Anxiety and Depression Association of [place] (ADAA): https://www.clark.net/ National Alliance on Mental Illness (NAMI): www.nami.org Contact a health care provider if: You have a hard time staying focused or finishing daily tasks. You spend many hours a day feeling worried about everyday life. You become exhausted by worry. You start to have headaches or frequently feel tense. You develop chronic nausea or diarrhea. Get help right away if: You have a racing heart and shortness of breath. You have thoughts of hurting yourself or others. If you ever feel like you may hurt yourself or others, or have thoughts about taking your own life, get help right away. Go to your nearest emergency department or: Call your local emergency services (911 in the [place] Call a suicide crisis helpline, such as the [place] at 318-795-2857 or 988 in the [place]. This is open 24 hours a day in the [place] Text the Crisis Text Line at 680-584-4748 (in the [place].). Summary Taking steps to learn and use tension reduction techniques can help calm you and help prevent triggering an anxiety reaction. When used together, medicines, psychotherapy, and tension reduction techniques may be the most effective treatment. Family, friends, and partners can play a big part in supporting you. This information is not intended to replace advice given to you by your health care provider. Make sure you discuss  any questions you have with your health care provider. Document Revised: 02/18/2021 Document Reviewed: 11/16/2020 Elsevier Patient Education  2023 [employer].

## 2022-05-19 DIAGNOSIS — R69 Illness, unspecified: Secondary | ICD-10-CM | POA: Diagnosis not present

## 2022-05-26 DIAGNOSIS — R69 Illness, unspecified: Secondary | ICD-10-CM | POA: Diagnosis not present

## 2022-06-01 NOTE — Progress Notes (Unsigned)
Hospital follow up  Assessment and Plan: Hospital visit follow up for:   1. Generalized anxiety disorder Start Buspar as directed. Counseling services recommended. Patient provided contact number to schedule with Psych.   - busPIRone (BUSPAR) 10 MG tablet; Take 1 tab (10 mg) TID.  Dispense: 90 tablet; Refill: 1  2. Panic anxiety syndrome Take Propranolol as needed. Reviewed relaxation techniques.  Sleep hygiene. Recommended Cognitive Behavioral Therapy (CBT). Psychoeducation:  encouraged personality growth wand development through coping techniques and problem-solving skills. Limit/Decrease/Monitor drug/alcohol intake.     - propranolol (INDERAL) 10 MG tablet; Take 1 tablet daily as directed.  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  Dispense: 90 tablet; Refill: 0  4.  Generalized abdominal pain Suggest daily probiotic. Refer to GI for further review and evaluation of any underlying contributor to chronic N/V, abd pain. Continue to monitor  5.  Dizziness Refer to Neurology for consultation further evaluation and tmt of ongoing dizziness despite negative work up.    No orders of the defined types were placed in this encounter.  No orders of the defined types were placed in this encounter.   All medications were reviewed with patient and family and fully reconciled. All questions answered fully, and patient and family members were encouraged to call the office with any further questions or concerns. Discussed goal to avoid readmission related to this diagnosis.   Over 40 minutes of exam, counseling, chart review, and complex, high/moderate level critical decision making was performed this visit.    Future Appointments  Date Time Provider Department Center   06/02/2022 11:00 AM Adela Glimpse, NP [facility] None  08/16/2022 11:30 AM Ocie Doyne, MD [facility] None    HPI 26 y.o.female presents with mother for follow up for transition from recent ER visit. Admit date to the hospital was 04/26/22, patient was discharged from the hospital on 04/26/22 and our clinical staff contacted the office the day after discharge to set up a follow up appointment. The discharge summary, medications, and diagnostic test results were reviewed before meeting with the patient. The patient was admitted for:   Dizziness and giddiness along with abdominal pain. sxs of abdominal pain that have been going on for years.  She does have a hx of cholecystectomy with past colonoscopy and EGD.  Reports negative work up.  She did note that she felt faint, nausea, dizziness, head throbbing, with a fall 5 months ago (hitting head at that time and with loss of consciousness).  She reports associated vomiting.  Reports vomiting up medications, including Effexor which she states alternates her mood and anxiety d/t not being able to get full effect of medication.  She has been feeling overwhelming stressed.  She reports having an psychologist at agee 17.  Has not been in therapy since.  She has been on the same dose of Effexor for "many years."  Reports a "rough" high school experience.  She does have hx of past abd surgery. The abdominal exam was reassuring and felt to be noncontributory to surgical etiology.  She was provided fluids and ask to f/u with PCP.  EKG Negative with NSR.  CT Head wo Contrast:  Negative  CXR:  Negative  Home health is not involved.   Images while in the hospital: CT Head Wo  Contrast  Result Date: 04/26/2022 CLINICAL DATA:  Provided history: Dizziness, persistent/recurrent, cardiac or vascular cause suspected. Additional history provided: Patient reports feeling faint, nausea, dizziness, head throbbing, fall 5 months ago (hitting head at that time and  with loss of consciousness). EXAM: CT HEAD WITHOUT CONTRAST TECHNIQUE: Contiguous axial images were obtained from the base of the skull through the vertex without intravenous contrast. RADIATION DOSE REDUCTION: This exam was performed according to the departmental dose-optimization program which includes automated exposure control, adjustment of the mA and/or kV according to patient size and/or use of iterative reconstruction technique. COMPARISON:  Images from MRI of the brain and orbits 09/02/2009 (report unavailable). FINDINGS: Brain: Cerebral volume is normal. There is no acute intracranial hemorrhage. No demarcated cortical infarct. No extra-axial fluid collection. No evidence of an intracranial mass. No midline shift. Vascular: No hyperdense vessel. Skull: No fracture or aggressive osseous lesion. Sinuses/Orbits: No mass or acute finding within the imaged orbits. No significant paranasal sinus disease at the imaged levels. IMPRESSION: No evidence of acute intracranial abnormality. Electronically Signed   By: Kellie Simmering D.O.   On: 04/26/2022 11:40   DG Chest 1 View  Result Date: 04/26/2022 CLINICAL DATA:  26 year old female with headache, dizziness, abdominal pain. EXAM: CHEST  1 VIEW COMPARISON:  None Available. FINDINGS: Portable AP semi upright view at 1001 hours. Lung volumes and mediastinal contours are within normal limits. Visualized tracheal air column is within normal limits. Allowing for portable technique the lungs are clear. No pneumoperitoneum identified and negative visible bowel gas. No osseous abnormality identified. IMPRESSION: Negative portable chest. Electronically Signed   By: Genevie Ann M.D.   On: 04/26/2022 10:08     Current Outpatient Medications (Endocrine & Metabolic):    metFORMIN (GLUCOPHAGE) 500 MG tablet, Take by mouth daily with breakfast.   norelgestromin-ethinyl estradiol Marilu Favre) 150-35 MCG/24HR transdermal patch, Place 1 patch onto the skin once a week. (Patient not  taking: Reported on 10/23/2020)   Norethin-Eth Estrad-Fe Biphas (LO LOESTRIN FE PO), Take by mouth.  Current Outpatient Medications (Cardiovascular):    propranolol (INDERAL) 10 MG tablet, Take 1 tablet daily as directed.  Current Outpatient Medications (Respiratory):    benzonatate (TESSALON) 200 MG capsule, Take 1 perle 3 x / day to prevent cough    Current Outpatient Medications (Other):    busPIRone (BUSPAR) 10 MG tablet, Take 1 tab (10 mg) TID.   docusate sodium (COLACE) 100 MG capsule, Take 100 mg by mouth 2 (two) times daily. (Patient not taking: Reported on 04/30/2022)   hydrOXYzine (ATARAX) 25 MG tablet, Take 1 tab (25 mg) as needed at bedtime for insomnia.   Melatonin 10 MG CAPS, Take by mouth.   ondansetron (ZOFRAN ODT) 8 MG disintegrating tablet, Dissolve  1 tablet  under tongue 3 x /day  as needed for Nausea   venlafaxine XR (EFFEXOR-XR) 75 MG 24 hr capsule, Take 3 capsules by mouth daily with breakfast.  Past Medical History:  Diagnosis Date   Anxiety    Gallstones    Headache    IBS (irritable bowel syndrome)    Major depressive disorder 2014   Nausea & vomiting    Panic attack    PCOS (polycystic ovarian syndrome)    Pseudotumor cerebri 2010    Allergies:  Allergies  Allergen Reactions   Sertraline Swelling    Laryngeal Edema   Bactrim [Sulfamethoxazole-Trimethoprim] Nausea Only    yeast   Medical History:  has Migraine without aura and responsive to treatment; Episodic paroxysmal  anxiety disorder; GERD (gastroesophageal reflux disease); Vitamin D deficiency; Obesity (BMI 30.0-34.9); Hyperlipidemia; and Elevated BP without diagnosis of hypertension on their problem list. Surgical History:  She  has a past surgical history that includes Wisdom tooth extraction (05/2014); Colonoscopy; Upper gi endoscopy; and Cholecystectomy (N/A, 10/24/2020). Family History:  Herfamily history includes Colon cancer in her maternal grandfather; Depression in her father and  mother; Diabetes in her paternal grandmother; Diverticulitis in her maternal aunt and maternal great-grandmother; Gallbladder disease in her father and mother; Hypertension in her father, mother, and paternal grandmother; Hypothyroidism in her mother; Pancreatic cancer in her maternal grandfather. Social History:   reports that she quit smoking about 3 years ago. Her smoking use included cigarettes. She has never used smokeless tobacco. She reports current alcohol use. She reports current drug use. Drug: Marijuana.   Allergies  Allergen Reactions   Sertraline Swelling    Laryngeal Edema   Bactrim [Sulfamethoxazole-Trimethoprim] Nausea Only    yeast    ROS: all negative except above.   Physical Exam: There were no vitals filed for this visit.  There were no vitals taken for this visit. General Appearance: Well nourished, in no apparent distress. Eyes: PERRLA, EOMs, conjunctiva no swelling or erythema Sinuses: No Frontal/maxillary tenderness ENT/Mouth: Ext aud canals clear, TMs without erythema, bulging. No erythema, swelling, or exudate on post pharynx.  Tonsils not swollen or erythematous. Hearing normal.  Neck: Supple, thyroid normal.  Respiratory: Respiratory effort normal, BS equal bilaterally without rales, rhonchi, wheezing or stridor.  Cardio: RRR with no MRGs. Brisk peripheral pulses without edema.  Abdomen: Soft, + BS.  Non tender, no guarding, rebound, hernias, masses. Lymphatics: Non tender without lymphadenopathy.  Musculoskeletal: Full ROM, 5/5 strength, normal gait.  Skin: Warm, dry without rashes, lesions, ecchymosis.  Neuro: Cranial nerves intact. Normal muscle tone, no cerebellar symptoms. Sensation intact.  Psych: Awake and oriented X 3, normal affect, Insight and Judgment appropriate.     Adela Glimpse, NP 11:01 PM [facility]

## 2022-06-02 ENCOUNTER — Encounter: Payer: Self-pay | Admitting: Nurse Practitioner

## 2022-06-02 ENCOUNTER — Ambulatory Visit: Payer: 59 | Admitting: Nurse Practitioner

## 2022-06-02 VITALS — BP 120/90 | HR 93 | Temp 97.1°F | Ht 70.0 in | Wt 207.0 lb

## 2022-06-02 DIAGNOSIS — R69 Illness, unspecified: Secondary | ICD-10-CM | POA: Diagnosis not present

## 2022-06-02 DIAGNOSIS — R112 Nausea with vomiting, unspecified: Secondary | ICD-10-CM

## 2022-06-02 DIAGNOSIS — R42 Dizziness and giddiness: Secondary | ICD-10-CM | POA: Diagnosis not present

## 2022-06-02 DIAGNOSIS — F411 Generalized anxiety disorder: Secondary | ICD-10-CM

## 2022-06-02 DIAGNOSIS — F410 Panic disorder [episodic paroxysmal anxiety] without agoraphobia: Secondary | ICD-10-CM | POA: Diagnosis not present

## 2022-06-02 DIAGNOSIS — G4701 Insomnia due to medical condition: Secondary | ICD-10-CM | POA: Diagnosis not present

## 2022-06-02 DIAGNOSIS — R1084 Generalized abdominal pain: Secondary | ICD-10-CM | POA: Diagnosis not present

## 2022-06-02 MED ORDER — LACTINEX PO CHEW: 1.0000 | CHEWABLE_TABLET | Freq: Three times a day (TID) | ORAL | 6 refills | Status: DC

## 2022-06-09 DIAGNOSIS — R69 Illness, unspecified: Secondary | ICD-10-CM | POA: Diagnosis not present

## 2022-06-30 ENCOUNTER — Ambulatory Visit: Payer: 59 | Admitting: Nurse Practitioner

## 2022-07-21 ENCOUNTER — Ambulatory Visit: Payer: 59 | Admitting: Nurse Practitioner

## 2022-08-05 ENCOUNTER — Encounter: Payer: Self-pay | Admitting: Nurse Practitioner

## 2022-08-05 ENCOUNTER — Ambulatory Visit: Payer: 59 | Admitting: Nurse Practitioner

## 2022-08-05 VITALS — BP 130/100 | HR 94 | Temp 97.2°F | Ht 70.0 in | Wt 206.8 lb

## 2022-08-05 DIAGNOSIS — R42 Dizziness and giddiness: Secondary | ICD-10-CM

## 2022-08-05 DIAGNOSIS — G4701 Insomnia due to medical condition: Secondary | ICD-10-CM | POA: Diagnosis not present

## 2022-08-05 DIAGNOSIS — E282 Polycystic ovarian syndrome: Secondary | ICD-10-CM

## 2022-08-05 DIAGNOSIS — F410 Panic disorder [episodic paroxysmal anxiety] without agoraphobia: Secondary | ICD-10-CM

## 2022-08-05 DIAGNOSIS — R1084 Generalized abdominal pain: Secondary | ICD-10-CM | POA: Diagnosis not present

## 2022-08-05 DIAGNOSIS — F411 Generalized anxiety disorder: Secondary | ICD-10-CM

## 2022-08-05 DIAGNOSIS — R69 Illness, unspecified: Secondary | ICD-10-CM | POA: Diagnosis not present

## 2022-08-05 MED ORDER — BUSPIRONE HCL 10 MG PO TABS: ORAL_TABLET | ORAL | 1 refills | Status: DC

## 2022-08-05 MED ORDER — METFORMIN HCL 500 MG PO TABS: 500.0000 mg | ORAL_TABLET | Freq: Every day | ORAL | 0 refills | Status: DC

## 2022-08-05 MED ORDER — PROPRANOLOL HCL 10 MG PO TABS: ORAL_TABLET | ORAL | 0 refills | Status: DC

## 2022-08-05 NOTE — Progress Notes (Signed)
Follow up  Assessment and Plan:  Generalized anxiety disorder Improved Continue Buspar 10 mg AM and afternoon PRN Take 20 mg PM. Continue weekly Psych visits   Panic anxiety syndrome Improved Continue Propranolol as needed - try 20 mg versus 10 mg. Limit/Decrease/Monitor drug/alcohol intake.     Insomnia due to medical condition Stop Hydroxyzine Continue Melatonin Increase Buspar to 20 mg PM Continue CBT Continue to monitor  Generalized abdominal pain/N/V Suggest daily probiotic. Continue therapy.   Continue to monitor  Dizziness Continue to eat a well balanced diet. Stay well hydrated. Continue to monitor  PCOS Continue Metformin Discussed doing research on dietary intake with PCOS Discussed new referral - patient to provide updated information for referral. Journal cycles, and moods.    Meds ordered this encounter  Medications   busPIRone (BUSPAR) 10 MG tablet    Sig: Take 1 tab in the morning and afternoon and 2 at night.    Dispense:  90 tablet    Refill:  1   propranolol (INDERAL) 10 MG tablet    Sig: Take 1 tablet daily as directed.    Dispense:  90 tablet    Refill:  0   metFORMIN (GLUCOPHAGE) 500 MG tablet    Sig: Take 1 tablet (500 mg total) by mouth daily with breakfast.    Dispense:  90 tablet    Refill:  0     Notify office for further evaluation and treatment, questions or concerns if any reported s/s fail to improve.   The patient was advised to call back or seek an in-person evaluation if any symptoms worsen or if the condition fails to improve as anticipated.   Further disposition pending results of labs. Discussed med's effects and SE's.    I discussed the assessment and treatment plan with the patient. The patient was provided an opportunity to ask questions and all were answered. The patient agreed with the plan and demonstrated an understanding of the instructions.  Discussed med's effects and SE's. Screening labs and tests as  requested with regular follow-up as recommended.  I provided 20 minutes of face-to-face time during this encounter including counseling, chart review, and critical decision making was preformed.   Future Appointments  Date Time Provider Department Center  08/16/2022 11:30 AM Ocie Doyne, MD [facility] None  10/13/2022 11:00 AM Adela Glimpse, NP [facility] None     Patient presents to office for a one month follow up.  She shares with me today that her therapist is out for one month and she has not been attending therapy weekly.  She felt as though this was very beneficial.  She has noticed increase in panic symptoms.   She has been taking Buspar 10 mg am and 20 mg pm She reports medication compliance. Can feel that this medication is working.  She was also provided Propranalol for panic taking 10 mg PRN.  Has taken PRN and reports panic is better controlled. She continues to use Melatonin for sleep.  She continues to have abdominal pain and bloating, as well as dizziness.  Feels as though the dizziness has been attributed to her not eating.  Does not eat at work for fear of being in the bathroom for "hours."  Has high stress job, works 40 hours of week.   Hx of cholecystectomy.  She also has a hx of PCOS, follows with GYN, requesting second opinion as she feels as though symptoms are not well managed.  Currently on Metformin.    Current Outpatient  Medications (Endocrine & Metabolic):    metFORMIN (GLUCOPHAGE) 500 MG tablet, Take by mouth daily with breakfast.   norelgestromin-ethinyl estradiol Burr Medico) 150-35 MCG/24HR transdermal patch, Place 1 patch onto the skin once a week.   Norethin-Eth Estrad-Fe Biphas (LO LOESTRIN FE PO), Take by mouth.  Current Outpatient Medications (Cardiovascular):    propranolol (INDERAL) 10 MG tablet, Take 1 tablet daily as directed.  Current Outpatient Medications (Respiratory):    benzonatate (TESSALON) 200 MG capsule, Take 1 perle 3 x / day to prevent  cough  Current Outpatient Medications (Analgesics):    aspirin EC 81 MG tablet, Take 81 mg by mouth daily. Swallow whole.   Current Outpatient Medications (Other):    busPIRone (BUSPAR) 10 MG tablet, Take 1 tab (10 mg) TID. (Patient taking differently: Take 1 tab in the morning and afternoon and 2 at night)   Cholecalciferol (VITAMIN D3 ADULT GUMMIES PO), Take by mouth.   Melatonin 10 MG CAPS, Take by mouth.   Multiple Vitamins-Minerals (MULTIVITAMIN GUMMIES ADULT PO), Take by mouth.   ondansetron (ZOFRAN ODT) 8 MG disintegrating tablet, Dissolve  1 tablet  under tongue 3 x /day  as needed for Nausea   venlafaxine XR (EFFEXOR-XR) 75 MG 24 hr capsule, Take 3 capsules by mouth daily with breakfast.   docusate sodium (COLACE) 100 MG capsule, Take 100 mg by mouth 2 (two) times daily. (Patient not taking: Reported on 04/30/2022)   hydrOXYzine (ATARAX) 25 MG tablet, Take 1 tab (25 mg) as needed at bedtime for insomnia. (Patient not taking: Reported on 06/02/2022)   lactobacillus acidophilus & bulgar (LACTINEX) chewable tablet, Chew 1 tablet by mouth 3 (three) times daily with meals. (Patient not taking: Reported on 08/05/2022)  Past Medical History:  Diagnosis Date   Anxiety    Gallstones    Headache    IBS (irritable bowel syndrome)    Major depressive disorder 2014   Nausea & vomiting    Panic attack    PCOS (polycystic ovarian syndrome)    Pseudotumor cerebri 2010    Allergies:  Allergies  Allergen Reactions   Sertraline Swelling    Laryngeal Edema   Bactrim [Sulfamethoxazole-Trimethoprim] Nausea Only    yeast   Medical History:  has Migraine without aura and responsive to treatment; Episodic paroxysmal anxiety disorder; GERD (gastroesophageal reflux disease); Vitamin D deficiency; Obesity (BMI 30.0-34.9); Hyperlipidemia; and Elevated BP without diagnosis of hypertension on their problem list. Surgical History:  She  has a past surgical history that includes Wisdom tooth  extraction (05/2014); Colonoscopy; Upper gi endoscopy; and Cholecystectomy (N/A, 10/24/2020). Family History:  Herfamily history includes Colon cancer in her maternal grandfather; Depression in her father and mother; Diabetes in her paternal grandmother; Diverticulitis in her maternal aunt and maternal great-grandmother; Gallbladder disease in her father and mother; Hypertension in her father, mother, and paternal grandmother; Hypothyroidism in her mother; Pancreatic cancer in her maternal grandfather. Social History:   reports that she quit smoking about 3 years ago. Her smoking use included cigarettes. She has never used smokeless tobacco. She reports current alcohol use. She reports current drug use. Drug: Marijuana.   Allergies  Allergen Reactions   Sertraline Swelling    Laryngeal Edema   Bactrim [Sulfamethoxazole-Trimethoprim] Nausea Only    yeast    ROS: all negative except above.   Physical Exam: Filed Weights   08/05/22 1121  Weight: 206 lb 12.8 oz (93.8 kg)    BP (!) 130/100   Pulse 94   Temp (!) 97.2 F (36.2  C)   Ht 5\' 10"  (1.778 m)   Wt 206 lb 12.8 oz (93.8 kg)   SpO2 98%   BMI 29.67 kg/m  General Appearance: Well nourished, in no apparent distress. Eyes: PERRLA, EOMs, conjunctiva no swelling or erythema Sinuses: No Frontal/maxillary tenderness ENT/Mouth: Ext aud canals clear, TMs without erythema, bulging. No erythema, swelling, or exudate on post pharynx.  Tonsils not swollen or erythematous. Hearing normal.  Neck: Supple, thyroid normal.  Respiratory: Respiratory effort normal, BS equal bilaterally without rales, rhonchi, wheezing or stridor.  Cardio: RRR with no MRGs. Brisk peripheral pulses without edema.  Abdomen: Soft, + BS.  Non tender, no guarding, rebound, hernias, masses. Lymphatics: Non tender without lymphadenopathy.  Musculoskeletal: Full ROM, 5/5 strength, normal gait.  Skin: Warm, dry without rashes, lesions, ecchymosis.  Neuro: Cranial nerves  intact. Normal muscle tone, no cerebellar symptoms. Sensation intact.  Psych: Awake and oriented X 3, normal affect, Insight and Judgment appropriate.     , NP 11:31 AM [facility]

## 2022-08-05 NOTE — Patient Instructions (Signed)

## 2022-08-16 ENCOUNTER — Telehealth: Payer: Self-pay | Admitting: Psychiatry

## 2022-08-16 ENCOUNTER — Ambulatory Visit: Payer: 59 | Admitting: Psychiatry

## 2022-08-16 NOTE — Telephone Encounter (Signed)
LVM and sent mychart msg advising pt of r/s needed for 1/8 appt- MD called out.

## 2022-08-23 ENCOUNTER — Other Ambulatory Visit: Payer: Self-pay | Admitting: Nurse Practitioner

## 2022-08-23 DIAGNOSIS — F411 Generalized anxiety disorder: Secondary | ICD-10-CM

## 2022-09-09 ENCOUNTER — Encounter: Payer: Self-pay | Admitting: Nurse Practitioner

## 2022-09-09 DIAGNOSIS — R112 Nausea with vomiting, unspecified: Secondary | ICD-10-CM

## 2022-09-09 MED ORDER — ONDANSETRON 8 MG PO TBDP: ORAL_TABLET | ORAL | 1 refills | Status: DC

## 2022-09-09 MED ORDER — VENLAFAXINE HCL ER 75 MG PO CP24: ORAL_CAPSULE | ORAL | 1 refills | Status: DC

## 2022-09-16 ENCOUNTER — Ambulatory Visit: Payer: 59 | Admitting: Nurse Practitioner

## 2022-09-16 NOTE — Progress Notes (Deleted)
Follow up  Assessment and Plan:  Generalized anxiety disorder Improved Continue Buspar 10 mg AM and afternoon PRN Take 20 mg PM. Continue weekly Psych visits   Panic anxiety syndrome Improved Continue Propranolol as needed - try 20 mg versus 10 mg. Limit/Decrease/Monitor drug/alcohol intake.     Insomnia due to medical condition Stop Hydroxyzine Continue Melatonin Increase Buspar to 20 mg PM Continue CBT Continue to monitor  Generalized abdominal pain/N/V Suggest daily probiotic. Continue therapy.   Continue to monitor  Dizziness Continue to eat a well balanced diet. Stay well hydrated. Continue to monitor  PCOS Continue Metformin Discussed doing research on dietary intake with PCOS Discussed new referral - patient to provide updated information for referral. Journal cycles, and moods.    No orders of the defined types were placed in this encounter.    Notify office for further evaluation and treatment, questions or concerns if any reported s/s fail to improve.   The patient was advised to call back or seek an in-person evaluation if any symptoms worsen or if the condition fails to improve as anticipated.   Further disposition pending results of labs. Discussed med's effects and SE's.    I discussed the assessment and treatment plan with the patient. The patient was provided an opportunity to ask questions and all were answered. The patient agreed with the plan and demonstrated an understanding of the instructions.  Discussed med's effects and SE's. Screening labs and tests as requested with regular follow-up as recommended.  I provided 20 minutes of face-to-face time during this encounter including counseling, chart review, and critical decision making was preformed.   Future Appointments  Date Time Provider [place]  09/16/2022 11:30 AM Darrol Jump, NP [facility] None  10/13/2022 11:00 AM Darrol Jump, NP [facility] None     Patient presents  to office for a one month follow up.  She shares with me today that her therapist is out for one month and she has not been attending therapy weekly.  She felt as though this was very beneficial.  She has noticed increase in panic symptoms.   She has been taking Buspar 10 mg am and 20 mg pm She reports medication compliance. Can feel that this medication is working.  She was also provided Propranalol for panic taking 10 mg PRN.  Has taken PRN and reports panic is better controlled. She continues to use Melatonin for sleep.  She continues to have abdominal pain and bloating, as well as dizziness.  Feels as though the dizziness has been attributed to her not eating.  Does not eat at work for fear of being in the bathroom for "hours."  Has high stress job, works 40 hours of week.   Hx of cholecystectomy.  She also has a hx of PCOS, follows with GYN, requesting second opinion as she feels as though symptoms are not well managed.  Currently on Metformin.    Current Outpatient Medications (Endocrine & Metabolic):    metFORMIN (GLUCOPHAGE) 500 MG tablet, Take 1 tablet (500 mg total) by mouth daily with breakfast.   norelgestromin-ethinyl estradiol Marilu Favre) 150-35 MCG/24HR transdermal patch, Place 1 patch onto the skin once a week.   Norethin-Eth Estrad-Fe Biphas (LO LOESTRIN FE PO), Take by mouth.  Current Outpatient Medications (Cardiovascular):    propranolol (INDERAL) 10 MG tablet, Take 1 tablet daily as directed.  Current Outpatient Medications (Respiratory):    benzonatate (TESSALON) 200 MG capsule, Take 1 perle 3 x / day to prevent cough  Current  Outpatient Medications (Analgesics):    aspirin EC 81 MG tablet, Take 81 mg by mouth daily. Swallow whole.   Current Outpatient Medications (Other):    busPIRone (BUSPAR) 10 MG tablet, Take 1 tablet by mouth in the morning and afternoon and take 2 tablets by mouth at night.   Cholecalciferol (VITAMIN D3 ADULT GUMMIES PO), Take by mouth.   docusate  sodium (COLACE) 100 MG capsule, Take 100 mg by mouth 2 (two) times daily. (Patient not taking: Reported on 04/30/2022)   hydrOXYzine (ATARAX) 25 MG tablet, Take 1 tab (25 mg) as needed at bedtime for insomnia. (Patient not taking: Reported on 06/02/2022)   lactobacillus acidophilus & bulgar (LACTINEX) chewable tablet, Chew 1 tablet by mouth 3 (three) times daily with meals. (Patient not taking: Reported on 08/05/2022)   Melatonin 10 MG CAPS, Take by mouth.   Multiple Vitamins-Minerals (MULTIVITAMIN GUMMIES ADULT PO), Take by mouth.   ondansetron (ZOFRAN ODT) 8 MG disintegrating tablet, Dissolve  1 tablet  under tongue 3 x /day  as needed for Nausea   venlafaxine XR (EFFEXOR-XR) 75 MG 24 hr capsule, Take 3 capsules by mouth daily with breakfast.  Past Medical History:  Diagnosis Date   Anxiety    Gallstones    Headache    IBS (irritable bowel syndrome)    Major depressive disorder 2014   Nausea & vomiting    Panic attack    PCOS (polycystic ovarian syndrome)    Pseudotumor cerebri 2010    Allergies:  Allergies  Allergen Reactions   Sertraline Swelling    Laryngeal Edema   Bactrim [Sulfamethoxazole-Trimethoprim] Nausea Only    yeast   Medical History:  has Migraine without aura and responsive to treatment; Episodic paroxysmal anxiety disorder; GERD (gastroesophageal reflux disease); Vitamin D deficiency; Obesity (BMI 30.0-34.9); Hyperlipidemia; and Elevated BP without diagnosis of hypertension on their problem list. Surgical History:  She  has a past surgical history that includes Wisdom tooth extraction (05/2014); Colonoscopy; Upper gi endoscopy; and Cholecystectomy (N/A, 10/24/2020). Family History:  Herfamily history includes Colon cancer in her maternal grandfather; Depression in her father and mother; Diabetes in her paternal grandmother; Diverticulitis in her maternal aunt and maternal great-grandmother; Gallbladder disease in her father and mother; Hypertension in her father,  mother, and paternal grandmother; Hypothyroidism in her mother; Pancreatic cancer in her maternal grandfather. Social History:   reports that she quit smoking about 4 years ago. Her smoking use included cigarettes. She has never used smokeless tobacco. She reports current alcohol use. She reports current drug use. Drug: Marijuana.   Allergies  Allergen Reactions   Sertraline Swelling    Laryngeal Edema   Bactrim [Sulfamethoxazole-Trimethoprim] Nausea Only    yeast    ROS: all negative except above.   Physical Exam: There were no vitals filed for this visit.   There were no vitals taken for this visit. General Appearance: Well nourished, in no apparent distress. Eyes: PERRLA, EOMs, conjunctiva no swelling or erythema Sinuses: No Frontal/maxillary tenderness ENT/Mouth: Ext aud canals clear, TMs without erythema, bulging. No erythema, swelling, or exudate on post pharynx.  Tonsils not swollen or erythematous. Hearing normal.  Neck: Supple, thyroid normal.  Respiratory: Respiratory effort normal, BS equal bilaterally without rales, rhonchi, wheezing or stridor.  Cardio: RRR with no MRGs. Brisk peripheral pulses without edema.  Abdomen: Soft, + BS.  Non tender, no guarding, rebound, hernias, masses. Lymphatics: Non tender without lymphadenopathy.  Musculoskeletal: Full ROM, 5/5 strength, normal gait.  Skin: Warm, dry without rashes, lesions,  ecchymosis.  Neuro: Cranial nerves intact. Normal muscle tone, no cerebellar symptoms. Sensation intact.  Psych: Awake and oriented X 3, normal affect, Insight and Judgment appropriate.     Darrol Jump, NP 9:12 AM [facility]

## 2022-09-22 ENCOUNTER — Other Ambulatory Visit: Payer: Self-pay | Admitting: Nurse Practitioner

## 2022-09-22 DIAGNOSIS — R112 Nausea with vomiting, unspecified: Secondary | ICD-10-CM

## 2022-09-22 DIAGNOSIS — F410 Panic disorder [episodic paroxysmal anxiety] without agoraphobia: Secondary | ICD-10-CM

## 2022-09-22 DIAGNOSIS — E282 Polycystic ovarian syndrome: Secondary | ICD-10-CM

## 2022-10-13 ENCOUNTER — Ambulatory Visit: Payer: 59 | Admitting: Nurse Practitioner

## 2022-10-13 NOTE — Progress Notes (Deleted)
Follow up  Assessment and Plan:  Generalized anxiety disorder Improved Continue Buspar 10 mg AM and afternoon PRN Take 20 mg PM. Continue weekly Psych visits   Panic anxiety syndrome Improved Continue Propranolol as needed - try 20 mg versus 10 mg. Limit/Decrease/Monitor drug/alcohol intake.     Insomnia due to medical condition Stop Hydroxyzine Continue Melatonin Increase Buspar to 20 mg PM Continue CBT Continue to monitor  Generalized abdominal pain/N/V Suggest daily probiotic. Continue therapy.   Continue to monitor  Dizziness Continue to eat a well balanced diet. Stay well hydrated. Continue to monitor  PCOS Continue Metformin Discussed doing research on dietary intake with PCOS Discussed new referral - patient to provide updated information for referral. Journal cycles, and moods.    No orders of the defined types were placed in this encounter.    Notify office for further evaluation and treatment, questions or concerns if any reported s/s fail to improve.   The patient was advised to call back or seek an in-person evaluation if any symptoms worsen or if the condition fails to improve as anticipated.   Further disposition pending results of labs. Discussed med's effects and SE's.    I discussed the assessment and treatment plan with the patient. The patient was provided an opportunity to ask questions and all were answered. The patient agreed with the plan and demonstrated an understanding of the instructions.  Discussed med's effects and SE's. Screening labs and tests as requested with regular follow-up as recommended.  I provided 20 minutes of face-to-face time during this encounter including counseling, chart review, and critical decision making was preformed.   Future Appointments  Date Time Provider [place]  10/13/2022 11:00 AM Darrol Jump, NP [facility] None     Patient presents to office for a one month follow up.  She shares with  me today that her therapist is out for one month and she has not been attending therapy weekly.  She felt as though this was very beneficial.  She has noticed increase in panic symptoms.   She has been taking Buspar 10 mg am and 20 mg pm She reports medication compliance. Can feel that this medication is working.  She was also provided Propranalol for panic taking 10 mg PRN.  Has taken PRN and reports panic is better controlled. She continues to use Melatonin for sleep.  She continues to have abdominal pain and bloating, as well as dizziness.  Feels as though the dizziness has been attributed to her not eating.  Does not eat at work for fear of being in the bathroom for "hours."  Has high stress job, works 40 hours of week.   Hx of cholecystectomy.  She also has a hx of PCOS, follows with GYN, requesting second opinion as she feels as though symptoms are not well managed.  Currently on Metformin.    Current Outpatient Medications (Endocrine & Metabolic):    metFORMIN (GLUCOPHAGE) 500 MG tablet, Take 1 tablet by mouth daily with breakfast.   norelgestromin-ethinyl estradiol Marilu Favre) 150-35 MCG/24HR transdermal patch, Place 1 patch onto the skin once a week.   Norethin-Eth Estrad-Fe Biphas (LO LOESTRIN FE PO), Take by mouth.  Current Outpatient Medications (Cardiovascular):    propranolol (INDERAL) 10 MG tablet, Take 1 tablet by mouth daily as directed.  Current Outpatient Medications (Respiratory):    benzonatate (TESSALON) 200 MG capsule, Take 1 perle 3 x / day to prevent cough  Current Outpatient Medications (Analgesics):    aspirin EC 81 MG  tablet, Take 81 mg by mouth daily. Swallow whole.   Current Outpatient Medications (Other):    busPIRone (BUSPAR) 10 MG tablet, Take 1 tablet by mouth in the morning and afternoon and take 2 tablets by mouth at night.   Cholecalciferol (VITAMIN D3 ADULT GUMMIES PO), Take by mouth.   docusate sodium (COLACE) 100 MG capsule, Take 100 mg by mouth 2 (two)  times daily. (Patient not taking: Reported on 04/30/2022)   hydrOXYzine (ATARAX) 25 MG tablet, Take 1 tab (25 mg) as needed at bedtime for insomnia. (Patient not taking: Reported on 06/02/2022)   lactobacillus acidophilus & bulgar (LACTINEX) chewable tablet, Chew 1 tablet by mouth 3 (three) times daily with meals. (Patient not taking: Reported on 08/05/2022)   Melatonin 10 MG CAPS, Take by mouth.   Multiple Vitamins-Minerals (MULTIVITAMIN GUMMIES ADULT PO), Take by mouth.   ondansetron (ZOFRAN-ODT) 8 MG disintegrating tablet, Dissolve 1 tablet under the tongue three times daily as needed for nausea.   venlafaxine XR (EFFEXOR-XR) 75 MG 24 hr capsule, Take 3 capsules by mouth daily with breakfast.  Past Medical History:  Diagnosis Date   Anxiety    Gallstones    Headache    IBS (irritable bowel syndrome)    Major depressive disorder 2014   Nausea & vomiting    Panic attack    PCOS (polycystic ovarian syndrome)    Pseudotumor cerebri 2010    Allergies:  Allergies  Allergen Reactions   Sertraline Swelling    Laryngeal Edema   Bactrim [Sulfamethoxazole-Trimethoprim] Nausea Only    yeast   Medical History:  has Migraine without aura and responsive to treatment; Episodic paroxysmal anxiety disorder; GERD (gastroesophageal reflux disease); Vitamin D deficiency; Obesity (BMI 30.0-34.9); Hyperlipidemia; and Elevated BP without diagnosis of hypertension on their problem list. Surgical History:  She  has a past surgical history that includes Wisdom tooth extraction (05/2014); Colonoscopy; Upper gi endoscopy; and Cholecystectomy (N/A, 10/24/2020). Family History:  Herfamily history includes Colon cancer in her maternal grandfather; Depression in her father and mother; Diabetes in her paternal grandmother; Diverticulitis in her maternal aunt and maternal great-grandmother; Gallbladder disease in her father and mother; Hypertension in her father, mother, and paternal grandmother; Hypothyroidism in  her mother; Pancreatic cancer in her maternal grandfather. Social History:   reports that she quit smoking about 4 years ago. Her smoking use included cigarettes. She has never used smokeless tobacco. She reports current alcohol use. She reports current drug use. Drug: Marijuana.   Allergies  Allergen Reactions   Sertraline Swelling    Laryngeal Edema   Bactrim [Sulfamethoxazole-Trimethoprim] Nausea Only    yeast    ROS: all negative except above.   Physical Exam: There were no vitals filed for this visit.   There were no vitals taken for this visit. General Appearance: Well nourished, in no apparent distress. Eyes: PERRLA, EOMs, conjunctiva no swelling or erythema Sinuses: No Frontal/maxillary tenderness ENT/Mouth: Ext aud canals clear, TMs without erythema, bulging. No erythema, swelling, or exudate on post pharynx.  Tonsils not swollen or erythematous. Hearing normal.  Neck: Supple, thyroid normal.  Respiratory: Respiratory effort normal, BS equal bilaterally without rales, rhonchi, wheezing or stridor.  Cardio: RRR with no MRGs. Brisk peripheral pulses without edema.  Abdomen: Soft, + BS.  Non tender, no guarding, rebound, hernias, masses. Lymphatics: Non tender without lymphadenopathy.  Musculoskeletal: Full ROM, 5/5 strength, normal gait.  Skin: Warm, dry without rashes, lesions, ecchymosis.  Neuro: Cranial nerves intact. Normal muscle tone, no cerebellar symptoms. Sensation  intact.  Psych: Awake and oriented X 3, normal affect, Insight and Judgment appropriate.     Darrol Jump, NP 9:21 AM [facility]

## 2022-11-02 ENCOUNTER — Ambulatory Visit: Payer: 59 | Admitting: Nurse Practitioner

## 2022-11-08 ENCOUNTER — Other Ambulatory Visit: Payer: Self-pay

## 2022-11-08 DIAGNOSIS — R112 Nausea with vomiting, unspecified: Secondary | ICD-10-CM

## 2022-11-08 MED ORDER — ONDANSETRON 8 MG PO TBDP: ORAL_TABLET | ORAL | 0 refills | Status: DC

## 2022-11-09 IMAGING — US US PELVIS COMPLETE WITH TRANSVAGINAL
1 series · 13 of 25 positions shown · non-contrast
Comparison: 12/17/2011

CLINICAL DATA: Secondary amenorrhea, LMP 04/27/2020, RIGHT pelvic
pain and cramping for 1 week

EXAM:
TRANSABDOMINAL AND TRANSVAGINAL ULTRASOUND OF PELVIS
TECHNIQUE: Both transabdominal and transvaginal ultrasound examinations of the
pelvis were performed. Transabdominal technique was performed for
global imaging of the pelvis including uterus, ovaries, adnexal
regions, and pelvic cul-de-sac. It was necessary to proceed with
endovaginal exam following the transabdominal exam to visualize the
endometrium and RIGHT ovary.

[Series 1: us pelvis complete with transvaginal · 0.28mm/px · 13 of 72 slices shown]
[im 1/72]
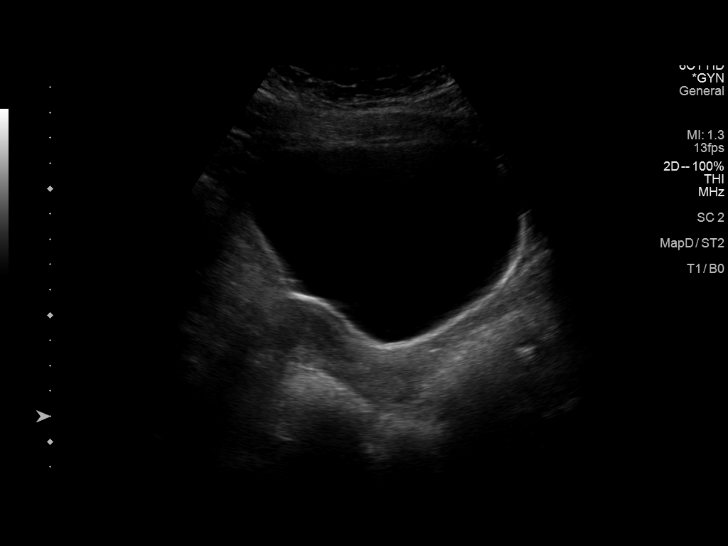
[im 6/72]
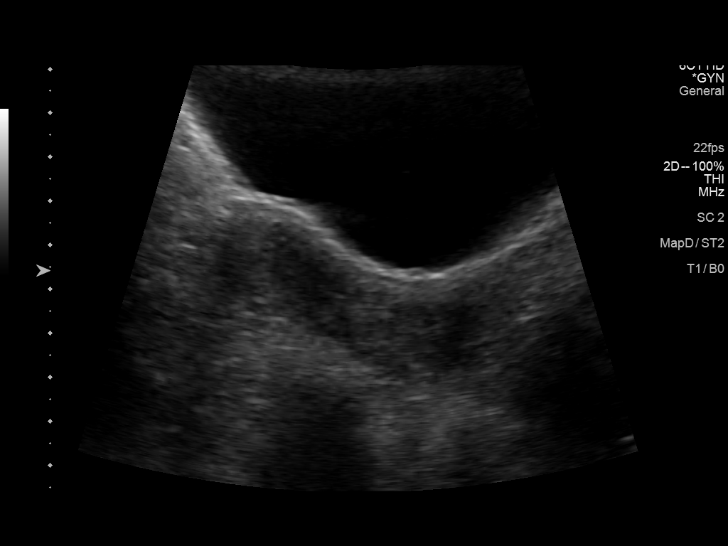
[im 12/72]
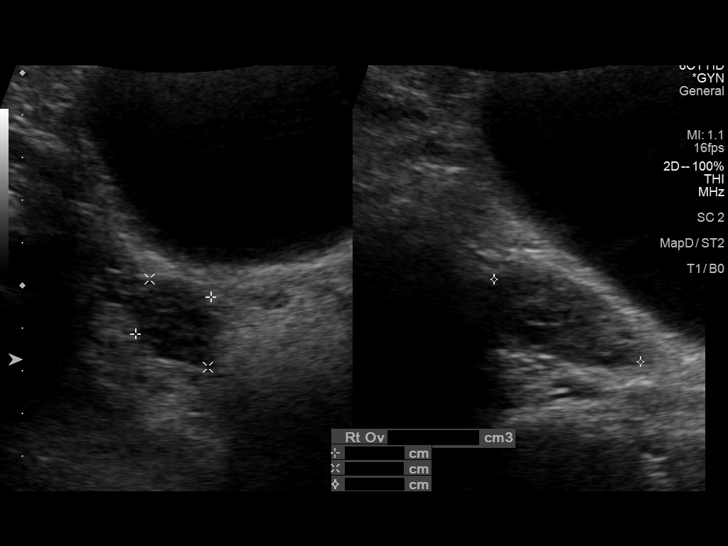
[im 18/72]
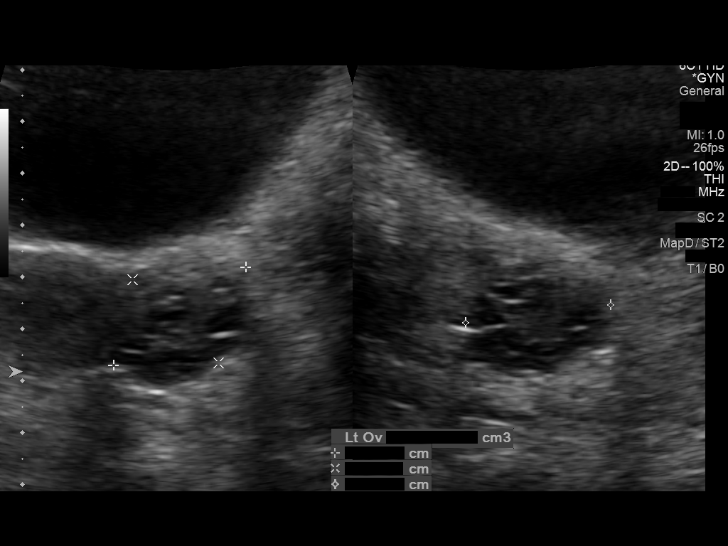
[im 24/72]
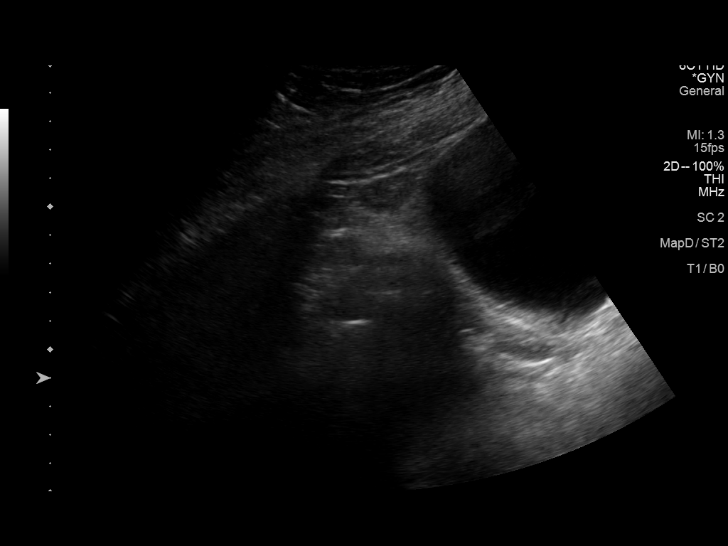
[im 30/72]
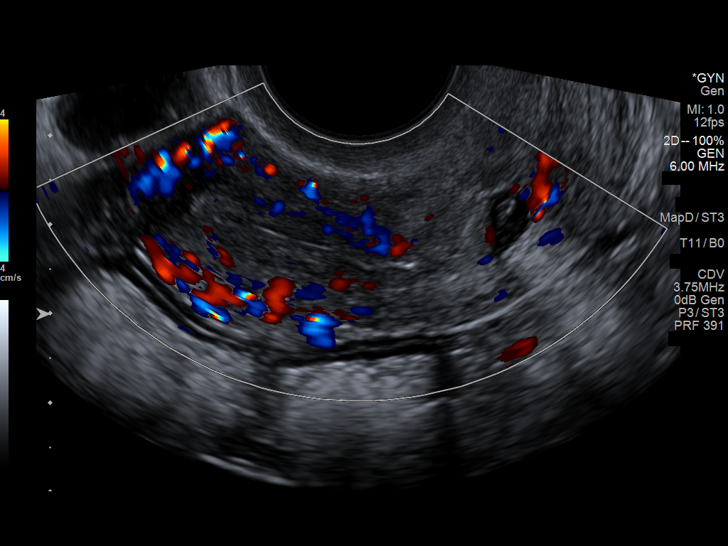
[im 36/72]
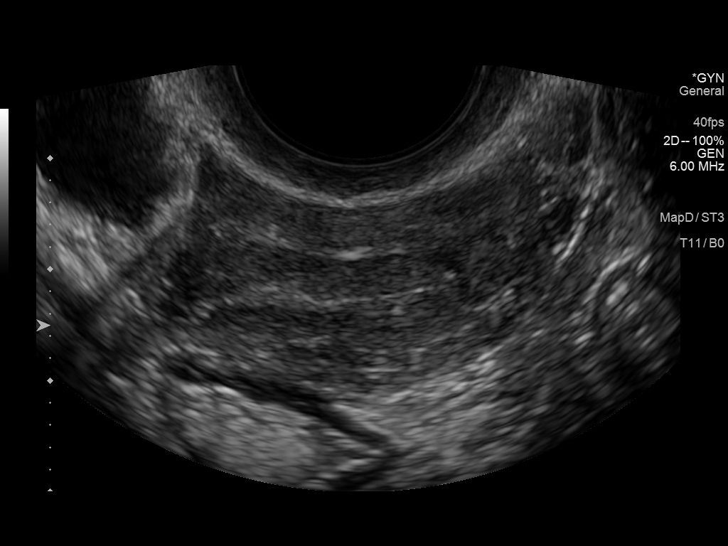
[im 42/72]
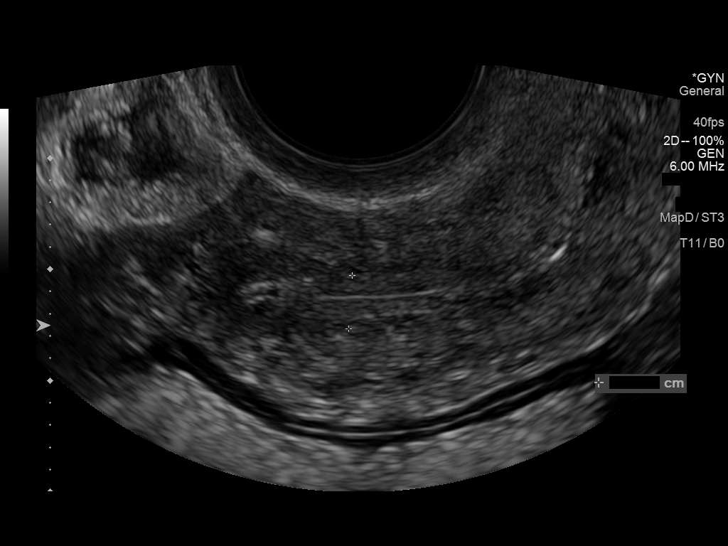
[im 48/72]
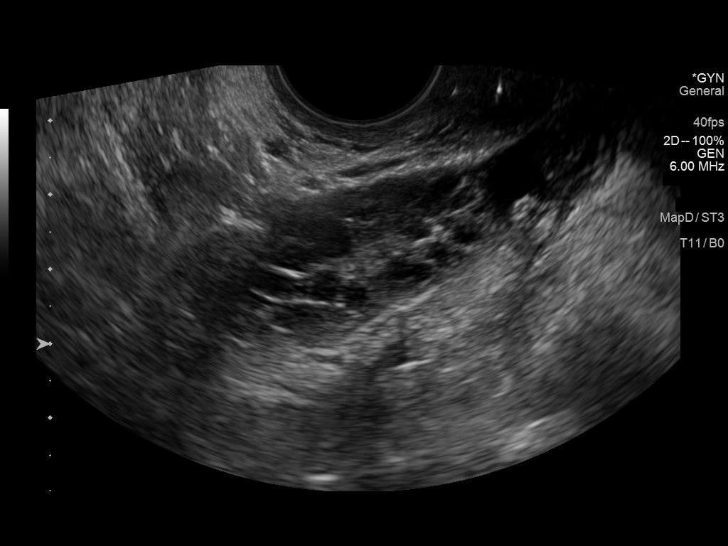
[im 54/72]
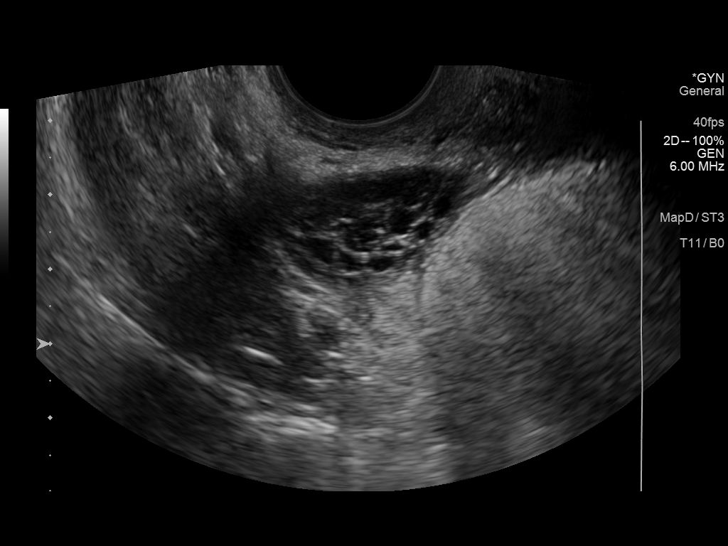
[im 60/72]
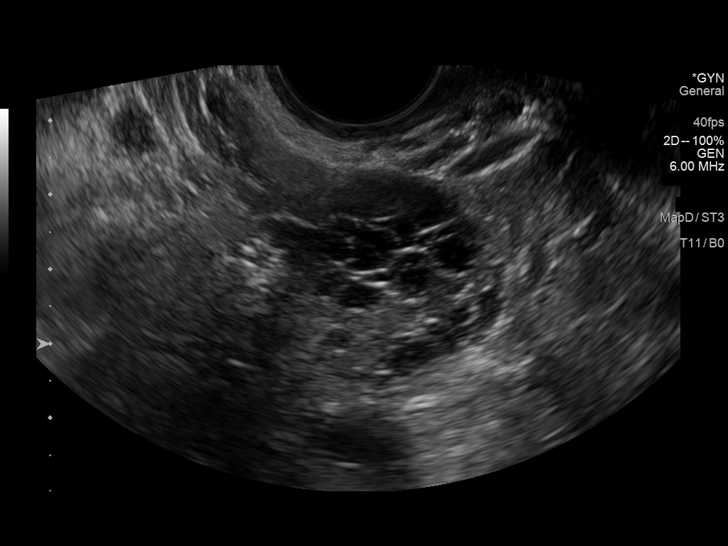
[im 66/72]
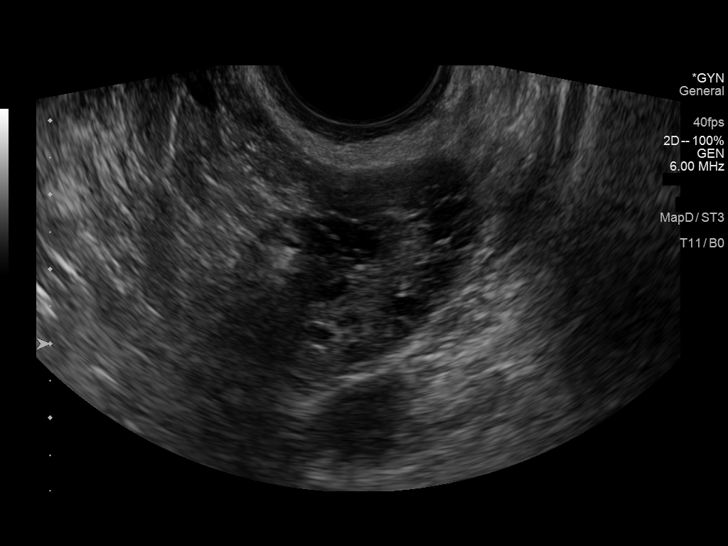
[im 72/72]
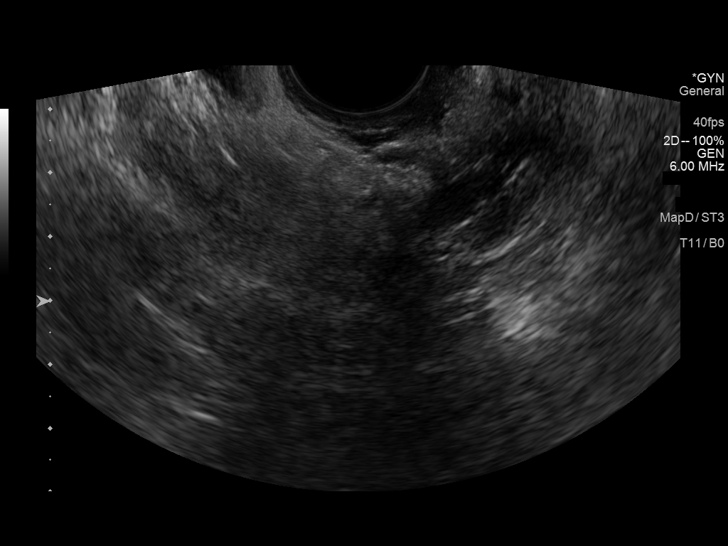

[13 of 25 positions shown; findings below may reference images not displayed]

FINDINGS: Uterus

Measurements: 6.9 x 2.6 x 3.1 cm = volume: 29 mL. Anteverted.
Prominent nabothian cyst at cervix. Otherwise normal morphology
without mass

Endometrium

Thickness: 5 mm.  No endometrial fluid or focal abnormality

Right ovary

Measurements: 2.8 x 4.7 x 2.0 cm = volume: 13.6 mL. Numerous
follicles throughout RIGHT ovary. No dominant mass.

Left ovary

Measurements: 4.0 x 2.2 x 2.5 cm = volume: 11 mL. Numerous follicles
throughout LEFT ovary. No dominant mass.

Other findings

No free pelvic fluid.  No adnexal masses.
IMPRESSION: Unremarkable uterus and endometrial complex.

Numerous follicles throughout both ovaries, nonspecific but can be
seen in patients with polycystic ovarian syndrome; recommend
clinical and laboratory correlation.

## 2022-11-18 ENCOUNTER — Ambulatory Visit: Payer: 59 | Admitting: Nurse Practitioner

## 2022-11-26 ENCOUNTER — Encounter: Payer: Self-pay | Admitting: Nurse Practitioner

## 2022-11-26 ENCOUNTER — Ambulatory Visit: Payer: 59 | Admitting: Nurse Practitioner

## 2022-11-26 VITALS — BP 130/80 | HR 95 | Temp 97.9°F | Resp 17 | Ht 70.0 in | Wt 202.6 lb

## 2022-11-26 DIAGNOSIS — R42 Dizziness and giddiness: Secondary | ICD-10-CM

## 2022-11-26 DIAGNOSIS — G4701 Insomnia due to medical condition: Secondary | ICD-10-CM

## 2022-11-26 DIAGNOSIS — E282 Polycystic ovarian syndrome: Secondary | ICD-10-CM

## 2022-11-26 DIAGNOSIS — F411 Generalized anxiety disorder: Secondary | ICD-10-CM | POA: Diagnosis not present

## 2022-11-26 DIAGNOSIS — R1084 Generalized abdominal pain: Secondary | ICD-10-CM | POA: Diagnosis not present

## 2022-11-26 NOTE — Progress Notes (Unsigned)
Follow up  Assessment and Plan:  Generalized anxiety disorder Improved - continue to wean off medication. Continue Buspar   Panic anxiety syndrome Improved  Insomnia due to medical condition Continue meditation Continue Melatonin  Generalized abdominal pain/N/V Resolved  Dizziness Resolved Continue to eat a well balanced diet. Stay well hydrated. Continue to monitor  PCOS Continue Metformin Discussed doing research on dietary intake with PCOS Discussed new referral - patient to provide updated information for referral. Journal cycles, and moods.    No orders of the defined types were placed in this encounter.  Notify office for further evaluation and treatment, questions or concerns if any reported s/s fail to improve.   The patient was advised to call back or seek an in-person evaluation if any symptoms worsen or if the condition fails to improve as anticipated.   Further disposition pending results of labs. Discussed med's effects and SE's.    I discussed the assessment and treatment plan with the patient. The patient was provided an opportunity to ask questions and all were answered. The patient agreed with the plan and demonstrated an understanding of the instructions.  Discussed med's effects and SE's. Screening labs and tests as requested with regular follow-up as recommended.  I provided 20 minutes of face-to-face time during this encounter including counseling, chart review, and critical decision making was preformed.  __________________________________   HPI:  Patient presents to office for a general follow up.    States that two women at work have recently been leg go which has allowed her stress and anxiety to improve. She is no longer taking medications.  She is partaking in Yoga which she reports is very helpful.  She is no longer having any panic, worry, or anxiety since her work structures has changed.  She is now working in an office alone.  She is  no longer attending therapy.  Does not feel the need.  Her mood is now calm and stable. She is no longer feeling as though she has to be medication for anxiety or panic as this is well under control.  Her abdominal pain and bloating has diminished and she is now eating a well balanced diet and staying well hydrated.  She is no longer feeling dizzy.   She also has a hx of PCOS, follows with GYN, requesting second opinion as she feels as though symptoms are not well managed.  She has not followed up but plans to.  Currently on Metformin.    Current Outpatient Medications (Endocrine & Metabolic):    metFORMIN (GLUCOPHAGE) 500 MG tablet, Take 1 tablet by mouth daily with breakfast.   norelgestromin-ethinyl estradiol [employer]) 150-35 MCG/24HR transdermal patch, Place 1 patch onto the skin once a week.   Norethin-Eth Estrad-Fe Biphas (LO LOESTRIN FE PO), Take by mouth.  Current Outpatient Medications (Cardiovascular):    propranolol (INDERAL) 10 MG tablet, Take 1 tablet by mouth daily as directed.  Current Outpatient Medications (Respiratory):    benzonatate (TESSALON) 200 MG capsule, Take 1 perle 3 x / day to prevent cough (Patient not taking: Reported on 11/26/2022)  Current Outpatient Medications (Analgesics):    aspirin EC 81 MG tablet, Take 81 mg by mouth daily. Swallow whole.   Current Outpatient Medications (Other):    busPIRone (BUSPAR) 10 MG tablet, Take 1 tablet by mouth in the morning and afternoon and take 2 tablets by mouth at night.   Cholecalciferol (VITAMIN D3 ADULT GUMMIES PO), Take by mouth.   Melatonin 10 MG CAPS, Take  by mouth.   Multiple Vitamins-Minerals (MULTIVITAMIN GUMMIES ADULT PO), Take by mouth.   ondansetron (ZOFRAN-ODT) 8 MG disintegrating tablet, Dissolve 1 tablet under the tongue three times daily as needed for nausea.   venlafaxine XR (EFFEXOR-XR) 75 MG 24 hr capsule, Take 3 capsules by mouth daily with breakfast.   docusate sodium (COLACE) 100 MG capsule, Take 100  mg by mouth 2 (two) times daily. (Patient not taking: Reported on 04/30/2022)   hydrOXYzine (ATARAX) 25 MG tablet, Take 1 tab (25 mg) as needed at bedtime for insomnia. (Patient not taking: Reported on 06/02/2022)   lactobacillus acidophilus & bulgar (LACTINEX) chewable tablet, Chew 1 tablet by mouth 3 (three) times daily with meals. (Patient not taking: Reported on 08/05/2022)  Past Medical History:  Diagnosis Date   Anxiety    Gallstones    Headache    IBS (irritable bowel syndrome)    Major depressive disorder 2014   Nausea & vomiting    Panic attack    PCOS (polycystic ovarian syndrome)    Pseudotumor cerebri 2010    Allergies:  Allergies  Allergen Reactions   Sertraline Swelling    Laryngeal Edema   Bactrim [Sulfamethoxazole-Trimethoprim] Nausea Only    yeast   Medical History:  has Migraine without aura and responsive to treatment; Episodic paroxysmal anxiety disorder; GERD (gastroesophageal reflux disease); Vitamin D deficiency; Obesity (BMI 30.0-34.9); Hyperlipidemia; and Elevated BP without diagnosis of hypertension on their problem list. Surgical History:  She  has a past surgical history that includes Wisdom tooth extraction (05/2014); Colonoscopy; Upper gi endoscopy; and Cholecystectomy (N/A, 10/24/2020). Family History:  Herfamily history includes Colon cancer in her maternal grandfather; Depression in her father and mother; Diabetes in her paternal grandmother; Diverticulitis in her maternal aunt and maternal great-grandmother; Gallbladder disease in her father and mother; Hypertension in her father, mother, and paternal grandmother; Hypothyroidism in her mother; Pancreatic cancer in her maternal grandfather. Social History:   reports that she quit smoking about 4 years ago. Her smoking use included cigarettes. She has never used smokeless tobacco. She reports current alcohol use. She reports current drug use. Drug: Marijuana.   Allergies  Allergen Reactions    Sertraline Swelling    Laryngeal Edema   Bactrim [Sulfamethoxazole-Trimethoprim] Nausea Only    yeast    ROS: all negative except above.   Physical Exam: Filed Weights   11/26/22 1136  Weight: 202 lb 9.6 oz (91.9 kg)    BP 130/80   Pulse 95   Temp 97.9 F (36.6 C)   Resp 17   Ht  (1.778 m)   Wt 202 lb 9.6 oz (91.9 kg)   SpO2 96%   BMI 29.07 kg/m  General Appearance: Well nourished, in no apparent distress. Eyes: PERRLA, EOMs, conjunctiva no swelling or erythema Sinuses: No Frontal/maxillary tenderness ENT/Mouth: Ext aud canals clear, TMs without erythema, bulging. No erythema, swelling, or exudate on post pharynx.  Tonsils not swollen or erythematous. Hearing normal.  Neck: Supple, thyroid normal.  Respiratory: Respiratory effort normal, BS equal bilaterally without rales, rhonchi, wheezing or stridor.  Cardio: RRR with no MRGs. Brisk peripheral pulses without edema.  Abdomen: Soft, + BS.  Non tender, no guarding, rebound, hernias, masses. Lymphatics: Non tender without lymphadenopathy.  Musculoskeletal: Full ROM, 5/5 strength, normal gait.  Skin: Warm, dry without rashes, lesions, ecchymosis.  Neuro: Cranial nerves intact. Normal muscle tone, no cerebellar symptoms. Sensation intact.  Psych: Awake and oriented X 3, normal affect, Insight and Judgment appropriate.  Adela Glimpse, NP 11:15 PM [facility]

## 2022-11-28 NOTE — Patient Instructions (Signed)

## 2022-12-16 ENCOUNTER — Other Ambulatory Visit: Payer: Self-pay | Admitting: Nurse Practitioner

## 2022-12-16 DIAGNOSIS — F410 Panic disorder [episodic paroxysmal anxiety] without agoraphobia: Secondary | ICD-10-CM

## 2022-12-16 DIAGNOSIS — E282 Polycystic ovarian syndrome: Secondary | ICD-10-CM

## 2023-01-17 ENCOUNTER — Other Ambulatory Visit: Payer: Self-pay

## 2023-01-17 DIAGNOSIS — R112 Nausea with vomiting, unspecified: Secondary | ICD-10-CM

## 2023-01-17 MED ORDER — ONDANSETRON 8 MG PO TBDP: ORAL_TABLET | ORAL | 0 refills | Status: DC

## 2023-01-20 ENCOUNTER — Other Ambulatory Visit: Payer: Self-pay | Admitting: Nurse Practitioner

## 2023-02-15 ENCOUNTER — Other Ambulatory Visit: Payer: Self-pay

## 2023-02-15 DIAGNOSIS — F411 Generalized anxiety disorder: Secondary | ICD-10-CM

## 2023-02-15 MED ORDER — BUSPIRONE HCL 10 MG PO TABS: ORAL_TABLET | ORAL | 0 refills | Status: DC

## 2023-02-19 ENCOUNTER — Other Ambulatory Visit: Payer: Self-pay | Admitting: Nurse Practitioner

## 2023-02-19 DIAGNOSIS — R112 Nausea with vomiting, unspecified: Secondary | ICD-10-CM

## 2023-02-21 ENCOUNTER — Other Ambulatory Visit: Payer: Self-pay | Admitting: Internal Medicine

## 2023-02-21 DIAGNOSIS — R112 Nausea with vomiting, unspecified: Secondary | ICD-10-CM

## 2023-02-21 MED ORDER — ONDANSETRON 8 MG PO TBDP: ORAL_TABLET | ORAL | 0 refills | Status: DC

## 2023-03-08 IMAGING — US US ABDOMEN LIMITED RUQ/ASCITES
2 series · 14 of 25 positions shown · non-contrast
Comparison: 07/04/2018

CLINICAL DATA: Right upper quadrant pain

EXAM:
ULTRASOUND ABDOMEN LIMITED RIGHT UPPER QUADRANT

[Series 1: us abdomen limited · 13 of 54 slices shown]
[im 1/54]
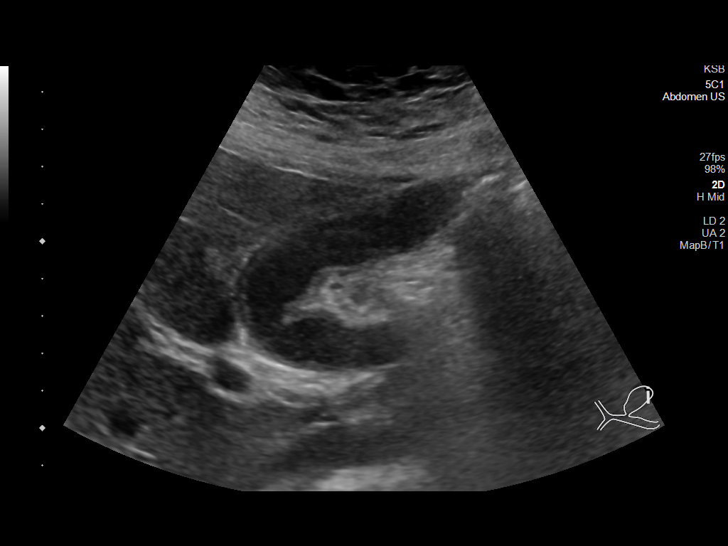
[im 5/54]
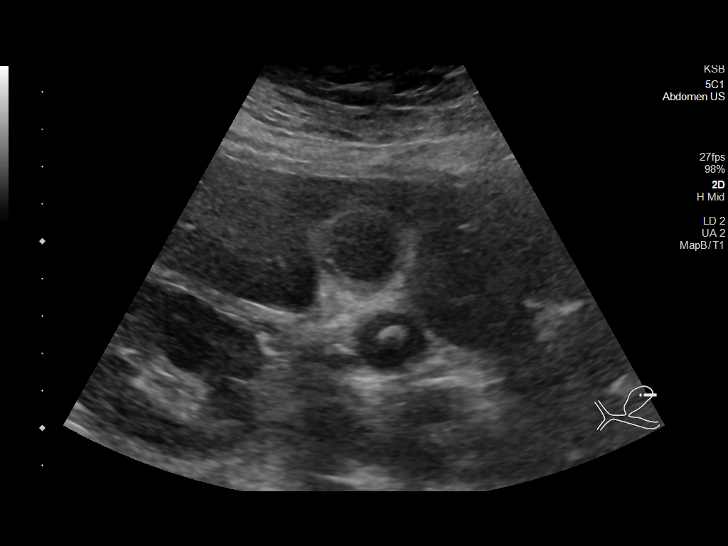
[im 10/54]
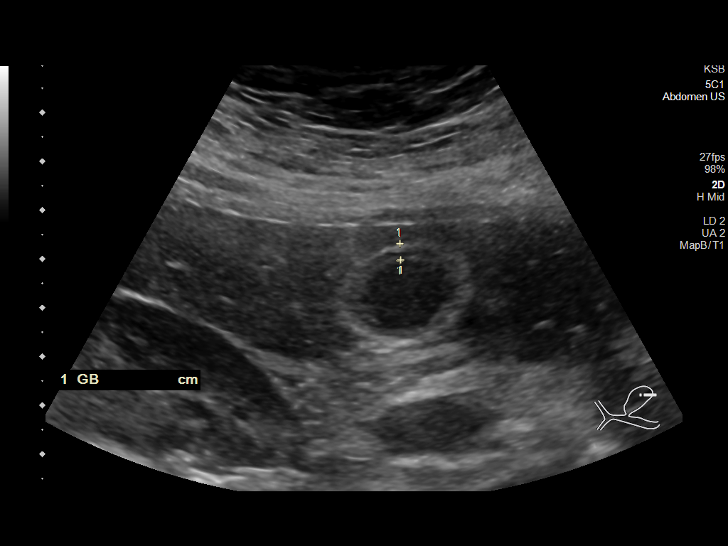
[im 14/54]
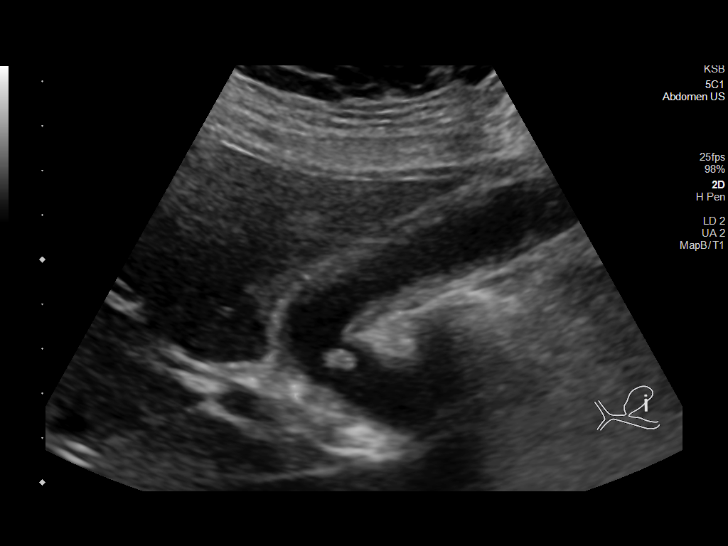
[im 19/54]
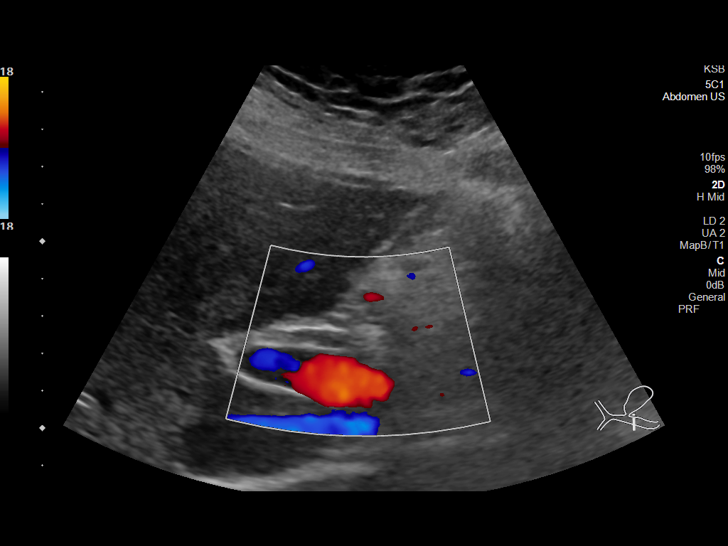
[im 21/54]
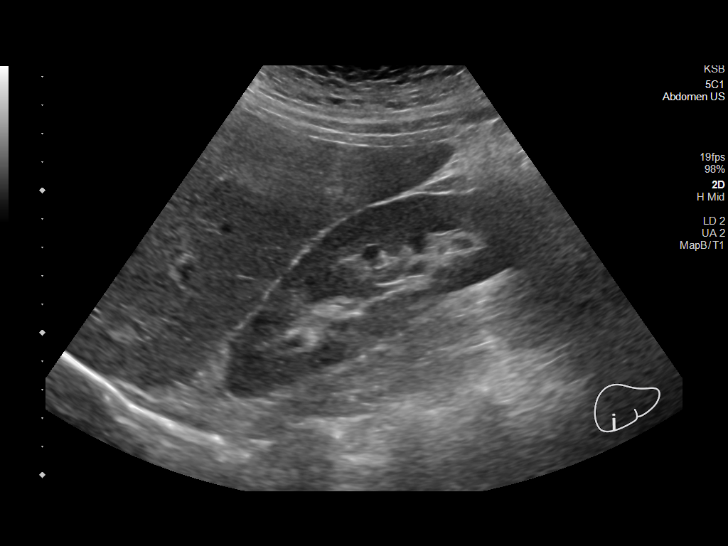
[im 26/54]
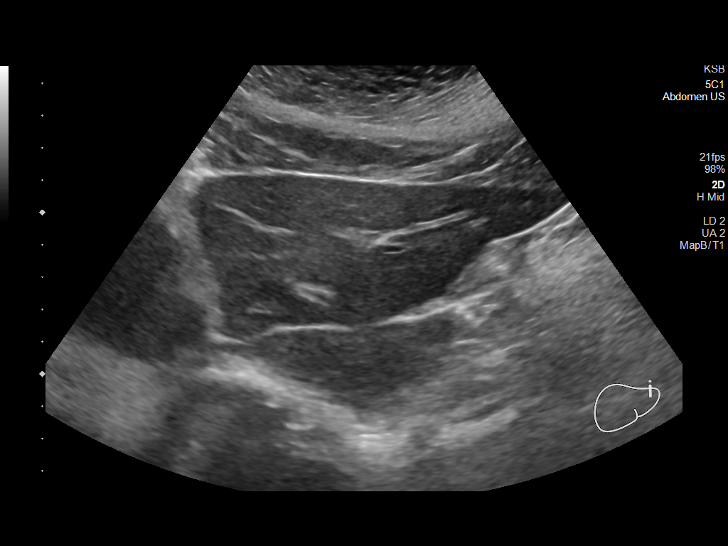
[im 30/54]
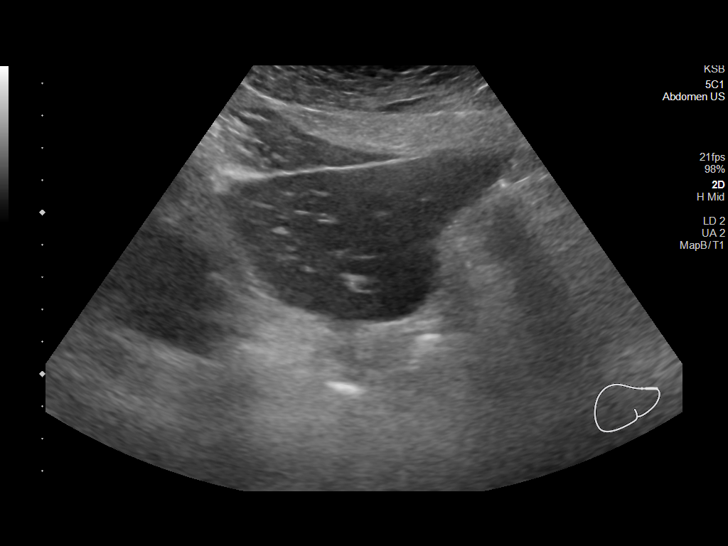
[im 35/54]
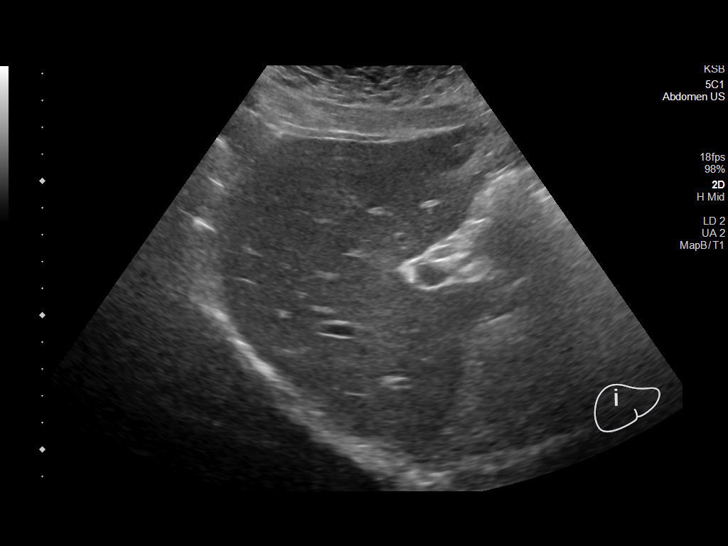
[im 37/54]
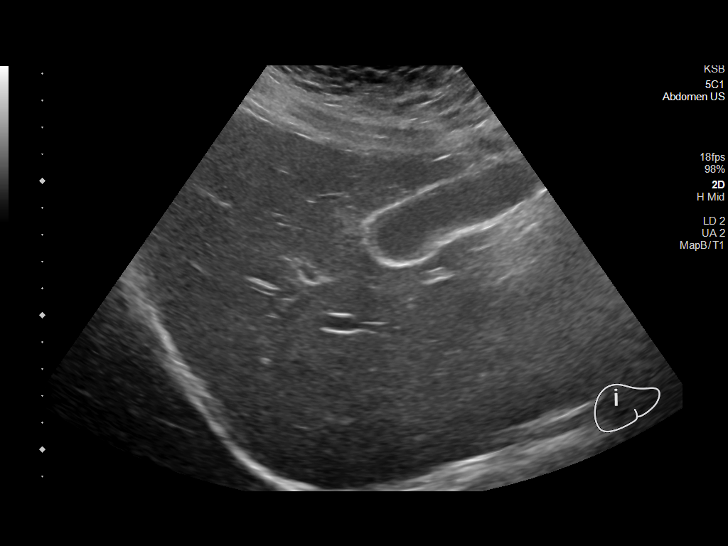
[im 42/54]
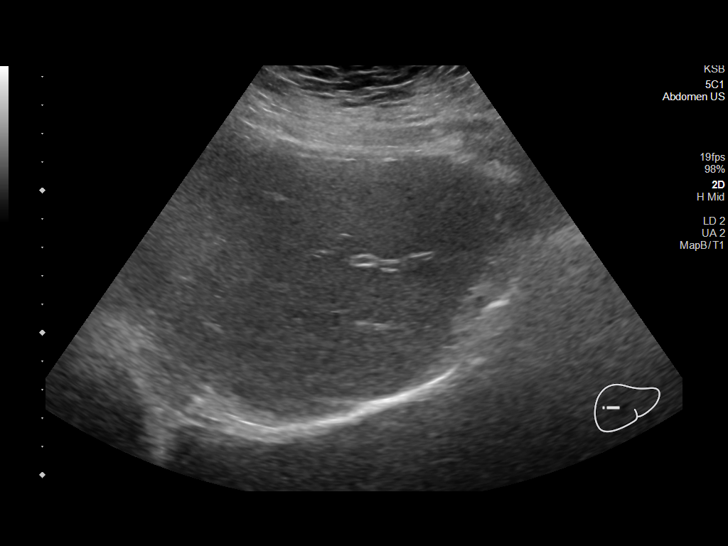
[im 47/54]
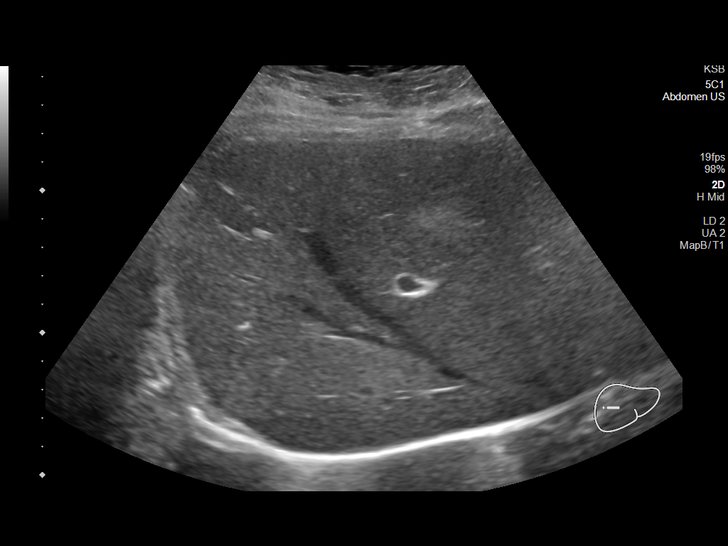
[im 51/54]
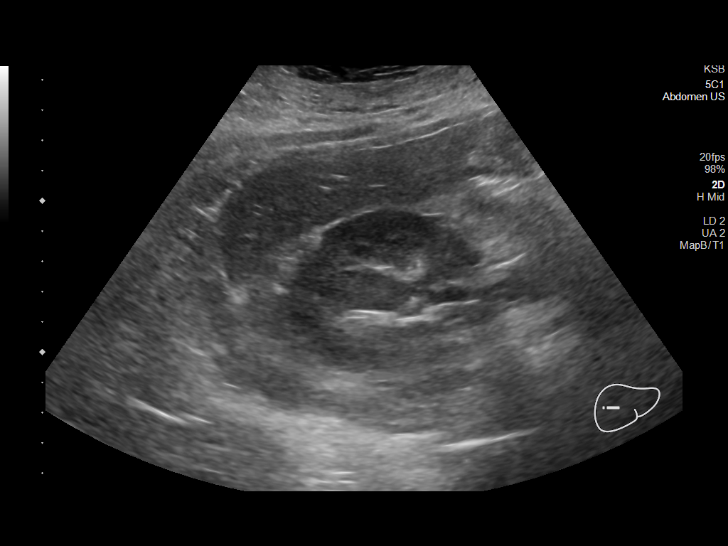

[Series 1001: abdomen us · 1 of 2 slices shown]
[im 1/2]
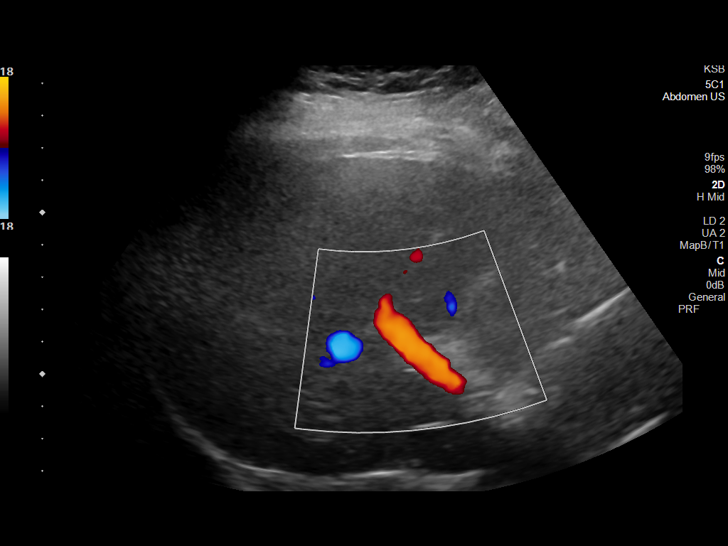

[14 of 25 positions shown; findings below may reference images not displayed]

FINDINGS: Gallbladder:

Gallbladder is partially distended with results wall thickening.
Negative sonographic Murphy's sign is elicited. Gall stones and
gallbladder polyps are noted.

Common bile duct:

Diameter: 5 mm.

Liver:

No focal lesion identified. Within normal limits in parenchymal
echogenicity. Portal vein is patent on color Doppler imaging with
normal direction of blood flow towards the liver.

Other: None.
IMPRESSION: Gallbladder is mildly decompressed. Gallbladder polyps and stones
are seen.

## 2023-03-15 ENCOUNTER — Other Ambulatory Visit: Payer: Self-pay | Admitting: Nurse Practitioner

## 2023-03-15 DIAGNOSIS — F411 Generalized anxiety disorder: Secondary | ICD-10-CM

## 2023-03-21 ENCOUNTER — Other Ambulatory Visit: Payer: Self-pay | Admitting: Nurse Practitioner

## 2023-03-21 DIAGNOSIS — F410 Panic disorder [episodic paroxysmal anxiety] without agoraphobia: Secondary | ICD-10-CM

## 2023-03-21 DIAGNOSIS — E282 Polycystic ovarian syndrome: Secondary | ICD-10-CM

## 2023-04-15 ENCOUNTER — Other Ambulatory Visit: Payer: Self-pay | Admitting: Nurse Practitioner

## 2023-06-01 ENCOUNTER — Ambulatory Visit: Payer: 59 | Admitting: Nurse Practitioner

## 2023-06-14 ENCOUNTER — Other Ambulatory Visit: Payer: Self-pay | Admitting: Nurse Practitioner

## 2023-06-14 DIAGNOSIS — E282 Polycystic ovarian syndrome: Secondary | ICD-10-CM

## 2023-06-28 ENCOUNTER — Ambulatory Visit: Payer: 59 | Admitting: Nurse Practitioner

## 2023-07-14 ENCOUNTER — Other Ambulatory Visit: Payer: Self-pay | Admitting: Nurse Practitioner

## 2023-07-14 DIAGNOSIS — F410 Panic disorder [episodic paroxysmal anxiety] without agoraphobia: Secondary | ICD-10-CM

## 2023-07-18 ENCOUNTER — Other Ambulatory Visit: Payer: Self-pay | Admitting: Nurse Practitioner

## 2023-07-20 ENCOUNTER — Ambulatory Visit: Payer: 59 | Admitting: Nurse Practitioner

## 2023-08-21 ENCOUNTER — Other Ambulatory Visit: Payer: Self-pay | Admitting: Nurse Practitioner

## 2023-08-21 DIAGNOSIS — E282 Polycystic ovarian syndrome: Secondary | ICD-10-CM

## 2023-11-29 ENCOUNTER — Ambulatory Visit: Payer: Self-pay | Admitting: Internal Medicine

## 2024-01-11 ENCOUNTER — Ambulatory Visit: Admitting: Internal Medicine

## 2024-01-11 NOTE — Progress Notes (Deleted)
 [facility] [address] [place] [address] Dept: [phone] Dept Fax: [phone]  New Patient Office Visit  Subjective:   Patricia Davis Feb 25, 1996 01/11/2024  No chief complaint on file.   HPI: Robinette Esters presents today to establish care at [facility]. Introduced to Publishing rights manager role and practice setting.  All questions answered.  Concerns: See below       The following portions of the patient's history were reviewed and updated as appropriate: past medical history, past surgical history, family history, social history, allergies, medications, and problem list.   Patient Active Problem List   Diagnosis Date Noted   Vitamin D  deficiency 11/16/2017   Obesity (BMI 30.0-34.9) 11/16/2017   Hyperlipidemia 11/16/2017   Elevated BP without diagnosis of hypertension 11/16/2017   GERD (gastroesophageal reflux disease) 01/21/2016   Episodic paroxysmal anxiety disorder 12/27/2012   Migraine without aura and responsive to treatment 10/18/2011   Past Medical History:  Diagnosis Date   Anxiety    Gallstones    Headache    IBS (irritable bowel syndrome)    Major depressive disorder 2014   Nausea & vomiting    Panic attack    PCOS (polycystic ovarian syndrome)    Pseudotumor cerebri 2010   Past Surgical History:  Procedure Laterality Date   CHOLECYSTECTOMY N/A 10/24/2020   Procedure: LAPAROSCOPIC CHOLECYSTECTOMY WITH INTRAOPERATIVE CHOLANGIOGRAM;  Surgeon: Lujean Sake, MD;  Location: [facility];  Service: General;  Laterality: N/A;   COLONOSCOPY     UPPER GI ENDOSCOPY     WISDOM TOOTH EXTRACTION  05/2014   Family History  Problem Relation Age of Onset   Depression Mother    Hypothyroidism Mother    Gallbladder disease Mother    Hypertension Mother    Depression Father    Gallbladder disease Father    Hypertension Father    Pancreatic cancer Maternal Grandfather     Colon cancer Maternal Grandfather    Diverticulitis Maternal Great-grandmother    Diverticulitis Maternal Aunt    Diabetes Paternal Grandmother    Hypertension Paternal Grandmother     Current Outpatient Medications:    aspirin EC 81 MG tablet, Take 81 mg by mouth daily. Swallow whole., Disp: , Rfl:    busPIRone  (BUSPAR ) 10 MG tablet, Take 1 tablet by mouth in the morning and afternoon and take 2 tablets by mouth at night., Disp: 90 tablet, Rfl: 0   Cholecalciferol (VITAMIN D3 ADULT GUMMIES PO), Take by mouth., Disp: , Rfl:    Melatonin 10 MG CAPS, Take by mouth., Disp: , Rfl:    metFORMIN  (GLUCOPHAGE ) 500 MG tablet, Take 1 tablet by mouth daily with breakfast., Disp: 90 tablet, Rfl: 0   Multiple Vitamins-Minerals (MULTIVITAMIN GUMMIES ADULT PO), Take by mouth., Disp: , Rfl:    norelgestromin-ethinyl estradiol  (XULANE ) 150-35 MCG/24HR transdermal patch, Place 1 patch onto the skin once a week., Disp: 12 patch, Rfl: 3   Norethin-Eth Estrad-Fe Biphas (LO LOESTRIN FE PO), Take by mouth., Disp: , Rfl:    ondansetron  (ZOFRAN -ODT) 8 MG disintegrating tablet, Dissolve 1 tablet under the tongue three times daily as needed for nausea., Disp: 30 tablet, Rfl: 0   propranolol  (INDERAL ) 10 MG tablet, Take 1 tablet by mouth daily as directed., Disp: 90 tablet, Rfl: 0   venlafaxine  XR (EFFEXOR -XR) 75 MG 24 hr capsule, Take 3 capsules by mouth daily with breakfast., Disp: 270 capsule, Rfl: 3 Allergies  Allergen Reactions   Sertraline Swelling  Laryngeal Edema   Bactrim [Sulfamethoxazole-Trimethoprim] Nausea Only    yeast    ROS: A complete ROS was performed with pertinent positives/negatives noted in the HPI. The remainder of the ROS are negative.   Objective:   There were no vitals filed for this visit.  GENERAL: Well-appearing, in NAD. Well nourished.  SKIN: Pink, warm and dry. No rash, lesion, ulceration, or ecchymoses.  NECK: Trachea midline. Full ROM w/o pain or tenderness. No  lymphadenopathy.  RESPIRATORY: Chest wall symmetrical. Respirations even and non-labored. Breath sounds clear to auscultation bilaterally.  CARDIAC: S1, S2 present, regular rate and rhythm. Peripheral pulses 2+ bilaterally.  EXTREMITIES: Without clubbing, cyanosis, or edema.  NEUROLOGIC: No motor or sensory deficits. Steady, even gait.  PSYCH/MENTAL STATUS: Alert, oriented x 3. Cooperative, appropriate mood and affect.   Health Maintenance Due  Topic Date Due   HPV VACCINES (1 - 3-dose series) Never done   Hepatitis C Screening  Never done   Cervical Cancer Screening (Pap smear)  Never done   COVID-19 Vaccine (4 - 2024-25 season) 04/10/2023    No results found for any visits on 01/11/24.  Assessment & Plan:   No orders of the defined types were placed in this encounter.  No orders of the defined types were placed in this encounter.   No follow-ups on file.   Gavin Kast, FNP

## 2024-05-08 ENCOUNTER — Telehealth: Payer: Self-pay

## 2024-05-08 NOTE — Telephone Encounter (Signed)
 Patients mother Carleen is calling asking to speak to the [employer]. Regarding her daughter being dismissed from the practice. She said her daughter was in the middle of a miscarriage and has proof. She said she was a former pt of DR Prentiss but I don't see that in her chart.  E2C2

## 2024-05-08 NOTE — Telephone Encounter (Addendum)
 Patricia Davis removed dismissal. Lvm to call and we could schedule her in Dec (first avaiilable) to see Dr Prentiss ( pts request) Called work number, its pts mother, Patricia Davis''s number. That was requested by pt thru E2C2

## 2024-05-09 NOTE — Telephone Encounter (Unsigned)
 Copied from CRM [id number]. Topic: Appointments - Scheduling Inquiry for Clinic >> May 08, 2024 10:58 AM Charolett L wrote: Reason for CRM: Daughters mother called in and stated was ejected from the practice Patients mother Ms. Calef states that the patient had a miscarriage and was unaware she had an appointment which is why she missed it. She states they can provide documentation from the patients OB if possible. The patient does not want to be dismissed from the practice.SABRA CB# [phone]

## 2024-08-06 ENCOUNTER — Ambulatory Visit: Admitting: Family Medicine

## 2024-08-06 ENCOUNTER — Encounter: Payer: Self-pay | Admitting: Family Medicine

## 2024-08-06 VITALS — BP 132/76 | HR 94 | Temp 98.7°F | Ht 69.0 in | Wt 221.0 lb

## 2024-08-06 DIAGNOSIS — Z6832 Body mass index (BMI) 32.0-32.9, adult: Secondary | ICD-10-CM | POA: Diagnosis not present

## 2024-08-06 DIAGNOSIS — F411 Generalized anxiety disorder: Secondary | ICD-10-CM

## 2024-08-06 DIAGNOSIS — F410 Panic disorder [episodic paroxysmal anxiety] without agoraphobia: Secondary | ICD-10-CM

## 2024-08-06 DIAGNOSIS — E66811 Obesity, class 1: Secondary | ICD-10-CM

## 2024-08-06 DIAGNOSIS — Z9049 Acquired absence of other specified parts of digestive tract: Secondary | ICD-10-CM

## 2024-08-06 DIAGNOSIS — F1290 Cannabis use, unspecified, uncomplicated: Secondary | ICD-10-CM | POA: Diagnosis not present

## 2024-08-06 DIAGNOSIS — Z8759 Personal history of other complications of pregnancy, childbirth and the puerperium: Secondary | ICD-10-CM | POA: Diagnosis not present

## 2024-08-06 MED ORDER — LAMOTRIGINE 25 MG PO TABS: 25.0000 mg | ORAL_TABLET | Freq: Every day | ORAL | 0 refills | Status: DC

## 2024-08-06 NOTE — Progress Notes (Unsigned)
 PCOS PMDD  HX OF PSEUDOTUMOR CEREBRI  LAMICTAL 25 THERAPY  ? SPRIVATO  ?IUD      Diagnoses and Orders:   No diagnosis found. Meds ordered this encounter  Medications   lamoTRIgine (LAMICTAL) 25 MG tablet    Sig: Take 1 tablet (25 mg total) by mouth daily. 1 TABLET DAILY X 1 WEEK, THEN INCREASE TO 2 TABS DAILY.    Dispense:  60 tablet    Refill:  0   No orders of the defined types were placed in this encounter.  Assessment & Plan:   Assessment and Plan Assessment & Plan Generalized anxiety disorder with premenstrual exacerbation (consider PMDD) Chronic anxiety worsens premenstrually, suggesting PMDD. Previous treatments partially effective. 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 Discussed Lamictal as a mood stabilizer, safe in pregnancy. Therapy options include CBT, EMDR, and Spravato for severe cases. - Started Lamictal at 25 mg, will titrate as needed. - Consider referral to therapist for CBT or EMDR. - Discussed potential use of Spravato for severe anxiety and depression. - Encouraged reduction of marijuana use by 75% over a few weeks, then 50%. - Discussed potential use of IUD for hormonal management and pregnancy prevention.  Polycystic ovarian syndrome PCOS contributes to hormonal fluctuations and potential PMDD symptoms. Previous metformin  use without significant side effects. Discussed PCOS's role in fertility and hormonal regulation. - Consider resuming metformin  for hormonal regulation. - Discussed potential use of IUD for hormonal management and pregnancy prevention.  Cannabis use disorder Daily marijuana use as anxiety coping mechanism. Discussed paradoxical effects on anxiety and importance of reduction. - Encouraged reduction of marijuana use by 75% over a few weeks, then 50%.  History of miscarriage Miscarriage at 4 weeks in July 2025. Discussed anxiety and hormonal impact on pregnancy outcomes. Considered pregnancy planning and anxiety management. -  Discussed pregnancy planning and anxiety management during pregnancy. - Considered use of IUD to prevent pregnancy until anxiety is better managed.  General Health Maintenance Discussed regular exercise and healthy lifestyle for anxiety and overall health management. - Encouraged regular exercise, such as walking and stretching. - Discussed importance of healthy lifestyle, including reducing alcohol and marijuana use.    Patricia Shutter, DO, MS, FAAFP, Dipl. [facility] [address] [place], [address] Dept: [phone] Dept Fax: [phone]  Subjective:   History of Present Illness Patricia Davis is a 28 year old female with anxiety and PCOS who presents with worsening anxiety symptoms. She is accompanied by her mother.  Anxiety symptoms - Severe anxiety with difficulty distinguishing mental from physical symptoms - Worsening anxiety around menstrual cycle, especially for one week each month - Near-syncope and blurry vision episode in summer 2023, resulting in emergency room visit - No benefit from propranolol  and another unspecified medication - Effexor  used since age 55, only effective medication after multiple prior trials - Mixed benefit from therapy - No recent presyncopal episodes, psychosis, hallucinations, or current self-harm behaviors - History of self-harm as a teenager, currently uses coping strategies  Menstrual irregularities and pcos - Diagnosed with PCOS in 2022 - Painful periods - Miscarriage at six weeks in July 2025 - Strong association between anxiety and menstrual cycle - Considered PMDD but not formally evaluated  Substance use - Consumes up to six alcoholic drinks per week - Uses marijuana daily to relax and aid sleep  Lifestyle and physical activity - Recently started exercising with walking, stretching, and weight lifting  Neurological history - 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  History:   Reviewed by clinician on day of visit: allergies, medications, problem list, medical history, surgical history, family history, social history, and previous encounter notes.  {History (Optional):23778}  Medications:   Show/hide medication list[1] Allergies[2]  Objective:   BP 132/76 (BP Location: Left Arm, Patient Position: Sitting, Cuff Size: Large)   Pulse 94   Temp 98.7 F (37.1 C) (Oral)   Ht 5' 9 (1.753 m)   Wt 221 lb (100.2 kg)   LMP 07/26/2024   SpO2 98%   BMI 32.64 kg/m  {Insert last BP/Wt (optional):23777}{See vitals history (optional):1}    Physical Exam  {Insert previous labs (optional):23779} {See past labs  Heme  Chem  Endocrine  Serology  Results Review (optional):1}    Attestations:   Patient is establishing care in this system with me as PCP. Available records reviewed. Chart updated today with reconciliation of problem list, medications, allergies, and relevant history. Preventive care and chronic disease status reviewed.  Outside labs reviewed and will be abstracted. Portions of historical chart may remain incomplete; will update on an ongoing basis as clinically indicated.  Reviewed by clinician on day of visit: allergies, medications, problem list, medical history, surgical history, family history, social history, and previous encounter notes. Discussed the use of AI scribe software for clinical note transcription with the patient, who gave verbal consent to proceed.       [1] Outpatient Medications Prior to Visit  Medication Sig   aspirin EC 81 MG tablet Take 81 mg by mouth daily. Swallow whole.   Melatonin 10 MG CAPS Take by mouth.   venlafaxine  XR (EFFEXOR -XR) 75 MG 24 hr capsule Take 3 capsules by mouth daily with breakfast.   [DISCONTINUED] busPIRone  (BUSPAR ) 10 MG tablet Take 1  tablet by mouth in the morning and afternoon and take 2 tablets by mouth at night.   [DISCONTINUED] Cholecalciferol (VITAMIN D3 ADULT GUMMIES PO) Take by mouth.   [DISCONTINUED] metFORMIN  (GLUCOPHAGE ) 500 MG tablet Take 1 tablet by mouth daily with breakfast. (Patient not taking: Reported on 08/06/2024)   [DISCONTINUED] Multiple Vitamins-Minerals (MULTIVITAMIN GUMMIES ADULT PO) Take by mouth.   [DISCONTINUED] norelgestromin-ethinyl estradiol  (XULANE ) 150-35 MCG/24HR transdermal patch Place 1 patch onto the skin once a week.   [DISCONTINUED] Norethin-Eth Estrad-Fe Biphas (LO LOESTRIN FE PO) Take by mouth. (Patient not taking: Reported on 08/06/2024)   [DISCONTINUED] ondansetron  (ZOFRAN -ODT) 8 MG disintegrating tablet Dissolve 1 tablet under the tongue three times daily as needed for nausea. (Patient not taking: Reported on 08/06/2024)   [DISCONTINUED] propranolol  (INDERAL ) 10 MG tablet Take 1 tablet by mouth daily as directed. (Patient not taking: Reported on 08/06/2024)   No facility-administered medications prior to visit.  [2] Allergies Allergen Reactions   Sertraline Swelling    Laryngeal Edema   Bactrim [Sulfamethoxazole-Trimethoprim] Nausea Only    yeast

## 2024-08-08 DIAGNOSIS — F411 Generalized anxiety disorder: Secondary | ICD-10-CM | POA: Insufficient documentation

## 2024-08-08 DIAGNOSIS — Z8759 Personal history of other complications of pregnancy, childbirth and the puerperium: Secondary | ICD-10-CM | POA: Insufficient documentation

## 2024-08-08 DIAGNOSIS — G932 Benign intracranial hypertension: Secondary | ICD-10-CM | POA: Insufficient documentation

## 2024-08-08 DIAGNOSIS — Z9049 Acquired absence of other specified parts of digestive tract: Secondary | ICD-10-CM | POA: Insufficient documentation

## 2024-08-13 ENCOUNTER — Telehealth: Payer: Self-pay

## 2024-08-13 NOTE — Telephone Encounter (Signed)
 Copied from CRM [id number]. Topic: Clinical - Prescription Issue >> Aug 13, 2024  8:55 AM Mercedes MATSU wrote: Reason for CRM: Pharmacy ([facility] called in wanting to get clarification on a medication. lamoTRIgine  (LAMICTAL ) 25 MG tablet, there are two sets are directions. Wants to confirm whcih set of directions are correct can be reached at [phone].

## 2024-08-13 NOTE — Telephone Encounter (Signed)
 [facility] and advised that patient is to take 1 tablet daily x 1 week and the 2 tablets daily there after.  They will process the RX. Dm/cma

## 2024-08-28 DIAGNOSIS — F411 Generalized anxiety disorder: Secondary | ICD-10-CM

## 2024-08-28 NOTE — Telephone Encounter (Signed)
 I have called [facility] and left a message cancelling the prescription. Okay to resend to the pharmacy requested by the pt? Also please see her question regarding her appt

## 2024-08-29 MED ORDER — LAMOTRIGINE 25 MG PO TABS: 25.0000 mg | ORAL_TABLET | Freq: Every day | ORAL | 0 refills | Status: AC

## 2024-09-06 ENCOUNTER — Encounter: Payer: Self-pay | Admitting: Family Medicine

## 2024-09-06 ENCOUNTER — Ambulatory Visit: Admitting: Family Medicine

## 2024-09-06 VITALS — BP 114/70 | HR 97 | Ht 69.5 in | Wt 217.4 lb

## 2024-09-06 DIAGNOSIS — E66811 Obesity, class 1: Secondary | ICD-10-CM | POA: Diagnosis not present

## 2024-09-06 DIAGNOSIS — F411 Generalized anxiety disorder: Secondary | ICD-10-CM

## 2024-09-06 DIAGNOSIS — F1290 Cannabis use, unspecified, uncomplicated: Secondary | ICD-10-CM | POA: Diagnosis not present

## 2024-09-06 DIAGNOSIS — Z6831 Body mass index (BMI) 31.0-31.9, adult: Secondary | ICD-10-CM

## 2024-09-06 DIAGNOSIS — N926 Irregular menstruation, unspecified: Secondary | ICD-10-CM | POA: Diagnosis not present

## 2024-09-06 NOTE — Progress Notes (Signed)
 "    Patient Care Team: Prentiss Frieze, DO as PCP - General (Family Medicine)  Diagnoses and Orders:   1. Irregular menses   2. GAD (generalized anxiety disorder)   3. Cannabis use disorder   4. Class 1 obesity without serious comorbidity with body mass index (BMI) of 31.0 to 31.9 in adult, unspecified obesity type    Assessment & Plan:   Assessment & Plan Generalized anxiety disorder with premenstrual exacerbation Anxiety remains unmanaged. She is not on additional medication. Working on pharmacologist and reducing cannabis use. - Continue Venlafaxine  XR 75 mg oral 3 capsules daily with breakfast, Melatonin 10 mg oral. - Encouraged coping skills and support systems. - Discussed benefits of Lamictal , starting with one tablet and increasing to two if needed.  Cannabis use disorder Reduced cannabis use from 100% to 75%. Working on further reduction and delaying use. - Encouraged continued reduction in cannabis use. - Discussed potential benefits of Lamictal  as an alternative coping mechanism.  Irregular menses Reports irregular menstrual cycles. Recent cycle lasted nine days. Miscarriage in September may contribute to irregularities. - Continue to monitor menstrual cycle for 3-6 months to assess for normalization. - Discussed potential impact of miscarriage on menstrual cycle.  Frieze Prentiss, DO, MS (Nutrition), FAAFP, Dipl. ABOM Fellow, [employer] Diplomate, American Board of Obesity Medicine [facility] [address] [place], [place] [address] Dept: [phone] Fax: [phone]  Subjective:   History of Present Illness Patricia Davis is a 29 year old female who presents with medication management and menstrual irregularities.  Menstrual irregularities - Menstrual period lasted nine days, which is longer than usual - Menses began two days earlier than expected, on January 16th - This  pattern is atypical for her menstrual cycles - History of miscarriage in September, with symptoms beginning approximately one week after September 9th  Anxiety and mood symptoms - Severe anxiety persists - No new symptoms or changes in anxiety since last visit - Partial benefit from therapy - No benefit from propranolol  - Prefers to have another adult present when caring for niece and nephew in case of significant anxiety or panic attack - Preparing to care for niece and nephew, which is both exciting and stressful  Medication management - Current medications include Lamictal , Effexor , and melatonin - No changes in medication regimen since last visit  Substance use - Working to reduce marijuana use and delay use until later in the day  Pseudotumor cerebri - History of pseudotumor cerebri  Review of Systems: Negative, with the exception of above mentioned in HPI.  History:   Reviewed by clinician on day of visit: allergies, medications, problem list, medical history, surgical history, family history, social history, and previous encounter notes.  Medications:   Show/hide medication list[1] Allergies[2]  Objective:   BP 114/70 (BP Location: Left Arm, Cuff Size: Large)   Pulse 97   Ht 5' 9.5 (1.765 m)   Wt 217 lb 6.4 oz (98.6 kg)   SpO2 97%   BMI 31.64 kg/m   Physical Exam Constitutional:      General: She is not in acute distress.    Appearance: She is well-developed.  HENT:     Head: Normocephalic and atraumatic.  Eyes:     Conjunctiva/sclera: Conjunctivae normal.  Cardiovascular:     Rate and Rhythm: Normal rate and regular rhythm.     Heart sounds: Normal heart sounds.  Pulmonary:     Effort: Pulmonary effort  is normal.     Breath sounds: Normal breath sounds.  Neurological:     General: No focal deficit present.     Mental Status: She is alert.  Psychiatric:        Behavior: Behavior normal.    Attestations:   Reviewed by clinician on day of visit:  allergies, medications, problem list, medical history, surgical history, family history, social history, and previous encounter notes.    [1]  Outpatient Medications Prior to Visit  Medication Sig   aspirin EC 81 MG tablet Take 81 mg by mouth daily. Swallow whole.   Melatonin 10 MG CAPS Take by mouth.   venlafaxine  XR (EFFEXOR -XR) 75 MG 24 hr capsule Take 3 capsules by mouth daily with breakfast.   lamoTRIgine  (LAMICTAL ) 25 MG tablet Take 1 tablet (25 mg total) by mouth daily. 1 TABLET DAILY X 1 WEEK, THEN INCREASE TO 2 TABS DAILY. (Patient not taking: Reported on 09/06/2024)   No facility-administered medications prior to visit.  [2]  Allergies Allergen Reactions   Sertraline Swelling    Laryngeal Edema   Bactrim [Sulfamethoxazole-Trimethoprim] Nausea Only    yeast   "

## 2024-11-05 ENCOUNTER — Ambulatory Visit: Admitting: Family Medicine
